# Patient Record
Sex: Female | Born: 1990 | Race: Black or African American | Hispanic: No | Smoking: Former smoker
Health system: Southern US, Community
[De-identification: ages and names within clinical notes are randomized; demographics above are authoritative.]

## PROBLEM LIST (undated history)

## (undated) ENCOUNTER — Inpatient Hospital Stay (HOSPITAL_COMMUNITY): Payer: Self-pay

## (undated) DIAGNOSIS — F209 Schizophrenia, unspecified: Secondary | ICD-10-CM

## (undated) DIAGNOSIS — F319 Bipolar disorder, unspecified: Secondary | ICD-10-CM

## (undated) DIAGNOSIS — S0502XA Injury of conjunctiva and corneal abrasion without foreign body, left eye, initial encounter: Secondary | ICD-10-CM

## (undated) HISTORY — PX: HIP SURGERY: SHX245

## (undated) HISTORY — DX: Injury of conjunctiva and corneal abrasion without foreign body, left eye, initial encounter: S05.02XA

---
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
## 2009-03-05 ENCOUNTER — Ambulatory Visit: Payer: Self-pay | Admitting: Family Medicine

## 2009-03-20 ENCOUNTER — Emergency Department (HOSPITAL_COMMUNITY): Admission: EM | Admit: 2009-03-20 | Discharge: 2009-03-20 | Payer: Self-pay | Admitting: Emergency Medicine

## 2009-03-21 ENCOUNTER — Ambulatory Visit: Payer: Self-pay | Admitting: Family Medicine

## 2009-03-21 ENCOUNTER — Telehealth: Payer: Self-pay | Admitting: Family Medicine

## 2009-03-21 DIAGNOSIS — F1910 Other psychoactive substance abuse, uncomplicated: Secondary | ICD-10-CM | POA: Insufficient documentation

## 2009-04-02 ENCOUNTER — Ambulatory Visit: Payer: Self-pay | Admitting: Family Medicine

## 2009-04-02 DIAGNOSIS — F39 Unspecified mood [affective] disorder: Secondary | ICD-10-CM | POA: Insufficient documentation

## 2009-05-07 ENCOUNTER — Encounter: Payer: Self-pay | Admitting: Family Medicine

## 2009-05-07 ENCOUNTER — Ambulatory Visit: Payer: Self-pay | Admitting: Family Medicine

## 2009-05-07 DIAGNOSIS — F319 Bipolar disorder, unspecified: Secondary | ICD-10-CM

## 2009-05-08 LAB — CONVERTED CEMR LAB
AST: 12 units/L (ref 0–37)
Albumin: 4.4 g/dL (ref 3.5–5.2)
Alkaline Phosphatase: 70 units/L (ref 39–117)
BUN: 11 mg/dL (ref 6–23)
Basophils Absolute: 0 10*3/uL (ref 0.0–0.1)
Basophils Relative: 0 % (ref 0–1)
Creatinine, Ser: 0.83 mg/dL (ref 0.40–1.20)
Eosinophils Absolute: 0.2 10*3/uL (ref 0.0–0.7)
Eosinophils Relative: 3 % (ref 0–5)
Glucose, Bld: 67 mg/dL — ABNORMAL LOW (ref 70–99)
HCT: 41.6 % (ref 36.0–46.0)
Hemoglobin: 13.7 g/dL (ref 12.0–15.0)
MCHC: 32.9 g/dL (ref 30.0–36.0)
MCV: 80.5 fL (ref 78.0–100.0)
Monocytes Absolute: 0.4 10*3/uL (ref 0.1–1.0)
Platelets: 315 10*3/uL (ref 150–400)
RDW: 15.4 % (ref 11.5–15.5)
TSH: 0.595 microintl units/mL (ref 0.350–4.500)

## 2009-05-29 ENCOUNTER — Telehealth: Payer: Self-pay | Admitting: Family Medicine

## 2009-06-03 ENCOUNTER — Ambulatory Visit: Payer: Self-pay | Admitting: Family Medicine

## 2009-06-05 ENCOUNTER — Ambulatory Visit: Payer: Self-pay | Admitting: Family Medicine

## 2009-06-05 ENCOUNTER — Encounter: Payer: Self-pay | Admitting: Family Medicine

## 2009-06-05 LAB — CONVERTED CEMR LAB
Eosinophils Absolute: 0.2 10*3/uL (ref 0.0–0.7)
HCT: 45.9 % (ref 36.0–46.0)
Lymphocytes Relative: 33 % (ref 12–46)
Lymphs Abs: 2 10*3/uL (ref 0.7–4.0)
MCV: 85.2 fL (ref 78.0–100.0)
Monocytes Relative: 8 % (ref 3–12)
Neutrophils Relative %: 56 % (ref 43–77)
Platelets: 365 10*3/uL (ref 150–400)
RBC: 5.39 M/uL — ABNORMAL HIGH (ref 3.87–5.11)
WBC: 6 10*3/uL (ref 4.0–10.5)

## 2009-06-26 ENCOUNTER — Ambulatory Visit: Payer: Self-pay | Admitting: Family Medicine

## 2009-06-27 ENCOUNTER — Encounter: Payer: Self-pay | Admitting: *Deleted

## 2009-06-30 ENCOUNTER — Ambulatory Visit: Payer: Self-pay | Admitting: Family Medicine

## 2009-06-30 ENCOUNTER — Encounter: Payer: Self-pay | Admitting: Family Medicine

## 2009-06-30 LAB — CONVERTED CEMR LAB
Cholesterol: 114 mg/dL (ref 0–169)
Hgb A1c MFr Bld: 5.1 %
Triglycerides: 148 mg/dL (ref <150)

## 2009-07-02 ENCOUNTER — Telehealth: Payer: Self-pay | Admitting: *Deleted

## 2009-07-02 ENCOUNTER — Telehealth: Payer: Self-pay | Admitting: Family Medicine

## 2009-08-23 ENCOUNTER — Encounter (INDEPENDENT_AMBULATORY_CARE_PROVIDER_SITE_OTHER): Payer: Self-pay | Admitting: *Deleted

## 2009-08-23 DIAGNOSIS — F17200 Nicotine dependence, unspecified, uncomplicated: Secondary | ICD-10-CM | POA: Insufficient documentation

## 2009-10-16 ENCOUNTER — Ambulatory Visit: Payer: Self-pay | Admitting: Family Medicine

## 2010-01-02 ENCOUNTER — Ambulatory Visit: Payer: Self-pay | Admitting: Family Medicine

## 2010-04-21 ENCOUNTER — Telehealth: Payer: Self-pay | Admitting: Family Medicine

## 2010-04-22 ENCOUNTER — Ambulatory Visit: Payer: Self-pay | Admitting: Family Medicine

## 2010-04-22 DIAGNOSIS — L299 Pruritus, unspecified: Secondary | ICD-10-CM | POA: Insufficient documentation

## 2010-06-03 ENCOUNTER — Telehealth: Payer: Self-pay | Admitting: Sports Medicine

## 2010-06-18 ENCOUNTER — Telehealth: Payer: Self-pay | Admitting: Family Medicine

## 2010-06-29 ENCOUNTER — Ambulatory Visit: Payer: Self-pay | Admitting: Family Medicine

## 2010-06-29 LAB — CONVERTED CEMR LAB: Beta hcg, urine, semiquantitative: NEGATIVE
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
## 2010-10-02 ENCOUNTER — Ambulatory Visit: Payer: Self-pay | Admitting: Family Medicine

## 2010-10-02 LAB — CONVERTED CEMR LAB: Beta hcg, urine, semiquantitative: NEGATIVE

## 2011-01-06 ENCOUNTER — Ambulatory Visit: Payer: Self-pay

## 2011-01-07 NOTE — Progress Notes (Signed)
Summary: triage   Phone Note Call from Patient Call back at [phone]   Caller: Patient Summary of Call: has a rash all over and wants to come in w/ her sister tomorrow am.  (9:45 to see Nyelli Samara) Initial call taken by: De Nurse,  Apr 21, 2010 2:07 PM  Follow-up for Phone Call        red bumps that itch on her buttocks x2-3 days. unable to place with pcp. will see Dr. Constance Goltz at 9:45 Follow-up by: Golden Circle RN,  Apr 21, 2010 2:28 PM

## 2011-01-07 NOTE — Assessment & Plan Note (Signed)
Summary: rash on buttocks/[place]/Shiree Altemus   Vital Signs:  Patient profile:   20 year old female Height:      64.5 inches Weight:      136.7 pounds BMI:     23.19 Temp:     99.2 degrees F oral Pulse rate:   101 / minute BP sitting:   117 / 78  (left arm) Cuff size:   regular  Vitals Entered By: Garen Grams LPN (Apr 22, 2010 10:00 AM) CC: rash Is Patient Diabetic? No Pain Assessment Patient in pain? no        Primary Care Provider:  Ancil Boozer  MD  CC:  rash.  History of Present Illness: rash: first noticed about 1 wk ago on lower back, buttocks and legs.  very itchy and back felt like it was swollen in the area.  these lesions have since resolved and now she has just 1 clearing lesion at this time between her toes on her left foot and on her R hand 3rd finger.  now the lesions are more like little fluid filled blisters.  it does not help them to be popped as they have done and in fact may make it worse.  she has tried some cream/salve without help.  heat makes it itch much worse.  of note mother and sister with whom she lives also have rashes though with different characteristics and within the past few months they all (including Layne) have been treated for scabies.  she denies fevers or other systemic symptoms at this time and has had no lesions in her mouth.  Habits & Providers  Alcohol-Tobacco-Diet     Tobacco Status: current     Tobacco Counseling: to quit use of tobacco products  Current Medications (verified): 1)  Clobetasol Propionate 0.05 % Soln (Clobetasol Propionate) .... Apply To Very Itchy Areas Two Times A Day Until 2-3 Days After Gone and Then As Needed. Disp 1 Bottle  Allergies (verified): No Known Drug Allergies  Past History:  Past Medical History: auditory neuropathy R ear. 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    on medial aspect of R third finger is small fluid filled blister on erythematous base.    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  discussed skin hygiene in setting of asthma and rx high potency steroid to itchy lesions.  return if worsens or doesn't improve.   Orders: [facility]- Est Level  3 (04540)  Complete Medication List: 1)  Clobetasol Propionate 0.05 % Soln (Clobetasol propionate) .... Apply to very itchy areas two times a day until 2-3 days after gone and then as needed. disp 1 bottle  Patient Instructions: 1)  use the prescription solution twice daily until 2-3 days after improved. 2)  All of the time keep your skin greasy - use lotions that are dye and fragrance free (such as eucerin, vaseline, cetaphil, aveeno, etc) Prescriptions: CLOBETASOL PROPIONATE 0.05 % SOLN (CLOBETASOL PROPIONATE) apply to very itchy areas two times  a day until 2-3 days after gone and then as needed. Disp 1 bottle  #1 x 1   Entered and Authorized by:   Ancil Boozer  MD   Signed by:   Ancil Boozer  MD on 04/22/2010   Method used:   Faxed to ...       [facility] (retail)       [address]       [place], [place]  [address]       Ph: [phone]       Fax: [phone]   RxID:   [id number]

## 2011-01-07 NOTE — Progress Notes (Signed)
Summary: resch   Phone Note Call from Patient   Caller: Patient Summary of Call: pt just called to resch appt b/c she has dental appt also Initial call taken by: De Nurse,  June 18, 2010 1:34 PM  Follow-up for Phone Call        Pt will still be [facility] Follow-up by: Jone Baseman CMA,  June 18, 2010 1:42 PM

## 2011-01-07 NOTE — Assessment & Plan Note (Signed)
Summary: depo inj,tcb   Nurse Visit   Allergies: No Known Drug Allergies  Medication Administration  Injection # 1:    Medication: Depo-Provera 150mg     Diagnosis: CONTRACEPTIVE MANAGEMENT NOS (ICD-V25.9)    Route: IM    Site: LUOQ gluteus    Lot #: [id number]    Mfr: greenstone    Comments: next depo due Aprol 15 thru April 03, 2010    Patient tolerated injection without complications    Given by: Theresia Lo RN (January 02, 2010 11:22 AM)  Orders Added: 1)  Depo-Provera 150mg  [J1055] 2)  Admin of Injection (IM/SQ) [45409]   Medication Administration  Injection # 1:    Medication: Depo-Provera 150mg     Diagnosis: CONTRACEPTIVE MANAGEMENT NOS (ICD-V25.9)    Route: IM    Site: LUOQ gluteus    Lot #: [id number]    Mfr: greenstone    Comments: next depo due Aprol 15 thru April 03, 2010    Patient tolerated injection without complications    Given by: Theresia Lo RN (January 02, 2010 11:22 AM)  Orders Added: 1)  Depo-Provera 150mg  [J1055] 2)  Admin of Injection (IM/SQ) [81191]

## 2011-01-07 NOTE — Progress Notes (Signed)
Summary: Emergency Line Call   Phone Note Call from Patient Call back at [phone]   Caller: Mom Summary of Call: Mother calling, daughter has lump on forearm with red lines tracking upwards.  Somewhat tender.  No fevers/chills, no N/V/D/C.  No cats in house, only dogs, no known entrance wound.  Sounds like lymphangitis, recommended call [facility] in AM for SDA.  Mother will do this. Initial call taken by: Rodney Langton MD,  June 03, 2010 11:42 PM

## 2011-01-07 NOTE — Assessment & Plan Note (Signed)
Summary: depo/eo   Nurse Visit   Allergies: No Known Drug Allergies Laboratory Results   Urine Tests  Date/Time Received: October 02, 2010 3:17 PM  Date/Time Reported: October 02, 2010 3:23 PM     Urine HCG: negative Comments: .............................................Marland KitchenGaren Grams LPN October 02, 2010 3:23 PM     Medication Administration  Injection # 1:    Medication: Depo-Provera 150mg     Diagnosis: CONTRACEPTIVE MANAGEMENT NOS (ICD-V25.9)    Route: IM    Site: L deltoid    Exp Date: 11/05/2012    Lot #: [id number]    Mfr: Francisca December    Comments: Next Depo Due: January 13 - January 27    Patient tolerated injection without complications    Given by: Garen Grams LPN (October 02, 2010 3:44 PM)  Orders Added: 1)  U Preg-[facility] [81025] 2)  Depo-Provera 150mg  [J1055] 3)  Est Level 1- [facility] [29562]    Medication Administration  Injection # 1:    Medication: Depo-Provera 150mg     Diagnosis: CONTRACEPTIVE MANAGEMENT NOS (ICD-V25.9)    Route: IM    Site: L deltoid    Exp Date: 11/05/2012    Lot #: [id number]    Mfr: Francisca December    Comments: Next Depo Due: January 13 - January 27    Patient tolerated injection without complications    Given by: Garen Grams LPN (October 02, 2010 3:44 PM)  Orders Added: 1)  U Preg-[facility] [81025] 2)  Depo-Provera 150mg  [J1055] 3)  Est Level 1- [facility] [78469]

## 2011-01-07 NOTE — Assessment & Plan Note (Signed)
Summary: cpe,df   Vital Signs:  Patient profile:   20 year old female Height:      64.5 inches Weight:      137.5 pounds BMI:     23.32 Temp:     98.5 degrees F oral Pulse rate:   68 / minute BP sitting:   130 / 90  (right arm) Cuff size:   regular  Vitals Entered By: Garen Grams LPN (June 29, 2010 10:25 AM) CC: cpe Is Patient Diabetic? No Pain Assessment Patient in pain? no        Primary Care Provider:  Ancil Boozer  MD  CC:  cpe.  History of Present Illness: 1) Itching: Prior history of scabies treatment Dec 2010, earlier this year as well. Had intially improved with treatment but has now returned. Has not tried anything for itching. Reports itching along arms, between toes and fingers x several weeks. Mom also here with same complaints. Denies fever, cills, URI symptoms, rash.   2) Contraceptive Management: Would like to restart Depo Provera (past due) for contraception. Does not report any side effects/ Not interested in other forms of contraception. Sexually active wit hintermittent condom use.   3) Tobacco use: Precontemplative. Denies chronic cough, dyspnea, wheeze.   Habits & Providers  Alcohol-Tobacco-Diet     Tobacco Status: current     Tobacco Counseling: to quit use of tobacco products  Current Medications (verified): 1)  None  Allergies (verified): No Known Drug Allergies  Social History: Lives with mom, sisters (21, 69, 53, 66).  uses marijuana, alcohol.  smokes.  sexually active since age 46. At [employer] for adult high school dipmloma. Wants to go to hair school.   Review of Systems       as per HPI. Also positive for mood swings. Otherwise negative for balance of 10 systems   Physical Exam  General:  Well-developed,well-nourished,in no acute distress; alert,appropriate and cooperative throughout examination VS reviewed  Head:  normocephalic and atraumatic.   Eyes:  pupils equal, round and reactive to light, extraoccular movements intact, no  conjunctivitis  Ears:  External ear exam shows no significant lesions or deformities.  Otoscopic examination reveals clear canals, tympanic membranes are intact bilaterally without bulging, retraction, inflammation or discharge. Hearing is grossly normal bilaterally. Nose:  External nasal examination shows no deformity or inflammation. Nasal mucosa are pink and moist without lesions or exudates. Mouth:  Oral mucosa and oropharynx without lesions or exudates.  Teeth in good repair. Neck:  No deformities, masses, or tenderness noted. Chest Wall:  No deformities, masses, or tenderness noted. Lungs:  Normal respiratory effort, chest expands symmetrically. Lungs are clear to auscultation, no crackles or wheezes. Heart:  Normal rate and regular rhythm. S1 and S2 normal without gallop, murmur, click, rub or other extra sounds. Abdomen:  Bowel sounds positive,abdomen soft and non-tender without masses, organomegaly or hernias noted. Msk:  No deformity or scoliosis noted of thoracic or lumbar spine.   Pulses:  2+ radials bilaterally  Extremities:  No clubbing, cyanosis, edema, or deformity noted with normal full range of motion of all joints.   Neurologic:  No cranial nerve deficits noted. Station and gait are normal. Plantar reflexes are down-going bilaterally. DTRs are symmetrical throughout. Sensory, motor and coordinative functions appear intact. Skin:  moderate excoriation between fingers and along arms especially at forearms dry skin    Impression & Recommendations:  Problem # 1:  UNSPECIFIED PRURITIC DISORDER (ICD-698.9) Likely return of scabies given presentation, household contacts with similar  symptoms. Will treat with permethrin. Repeat treatment in one week. Follow up six weeks. Benadryl as needed for itching.   Problem # 2:  CONTRACEPTIVE MANAGEMENT NOS (ICD-V25.9) Assessment: Comment Only Upreg negative. Restart Depo. Counseled on using condoms to prevent STDs, importance of returning q3  months for depo.  Orders: U Preg-[facility] ([address]) Depo-Provera 150mg  (U0454)  Problem # 3:  Preventive Health Care (ICD-V70.0) Assessment: Comment Only Normal physical exam today. No vaccines or screening indicated toady. Will follow up on bipolar disorder at return visit.   Problem # 4:  TOBACCO USER (ICD-305.1) Precontemplative. Follow up at next appointment.   Other Orders: [facility] - Est  18-39 yrs (09811)  Patient Instructions: 1)  Apply permethrin cream to entire body as instructed.  2)  You should restart your Symbyax. 3)  I would like you to come back in 6 weeks to discuss your bipolar disorder.  4)  If you have thoughts of or a plan for hurting yourself or others, please give [place] a call   Laboratory Results   Urine Tests  Date/Time Received: June 29, 2010 11:14 AM  Date/Time Reported: June 29, 2010 11:20 AM     Urine HCG: negative Comments: ...........test performed by...........Marland KitchenTerese Door, CMA   Blood Tests   Date/Time Received:         Medication Administration  Injection # 1:    Medication: Depo-Provera 150mg     Diagnosis: CONTRACEPTIVE MANAGEMENT NOS (ICD-V25.9)    Route: IM    Site: L deltoid    Exp Date: 03/06/2012    Lot #: [id number]    Mfr: Pfizer    Comments: Next Depo Due: October 10 - October 24    Patient tolerated injection without complications    Given by: Garen Grams LPN (June 29, 2010 11:35 AM)  Orders Added: 1)  U Preg-[facility] [81025] 2)  Depo-Provera 150mg  [J1055] 3)  [facility] - Est  18-39 yrs [99395]

## 2011-01-13 ENCOUNTER — Ambulatory Visit (INDEPENDENT_AMBULATORY_CARE_PROVIDER_SITE_OTHER): Payer: Medicaid Other | Admitting: Family Medicine

## 2011-01-13 DIAGNOSIS — Z3049 Encounter for surveillance of other contraceptives: Secondary | ICD-10-CM

## 2011-01-13 DIAGNOSIS — Z3042 Encounter for surveillance of injectable contraceptive: Secondary | ICD-10-CM

## 2011-01-13 DIAGNOSIS — Z309 Encounter for contraceptive management, unspecified: Secondary | ICD-10-CM | POA: Insufficient documentation

## 2011-01-13 LAB — POCT URINE PREGNANCY: Preg Test, Ur: NEGATIVE

## 2011-01-13 MED ORDER — MEDROXYPROGESTERONE ACETATE 150 MG/ML IM SUSP
150.0000 mg | Freq: Once | INTRAMUSCULAR | Status: AC
  Administered 2011-01-13: 150 mg via INTRAMUSCULAR

## 2011-01-13 NOTE — Assessment & Plan Note (Signed)
Level one exam nurse visit for depo no physician encounter

## 2011-01-13 NOTE — Progress Notes (Signed)
  Subjective:    Patient ID: Autumn Kaiser, female    DOB: 03-25-1991, 20 y.o.   MRN: [id number]  HPI   Level on e visit no physician encounter occurred only nurse visit for depo administration  Review of Systems     Objective:   Physical Exam        Assessment & Plan:

## 2011-01-25 ENCOUNTER — Encounter (INDEPENDENT_AMBULATORY_CARE_PROVIDER_SITE_OTHER): Payer: Medicaid Other | Admitting: Family Medicine

## 2011-01-25 ENCOUNTER — Encounter: Payer: Self-pay | Admitting: Family Medicine
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
## 2011-01-26 ENCOUNTER — Ambulatory Visit: Payer: Medicaid Other | Admitting: Family Medicine

## 2011-01-26 NOTE — Progress Notes (Signed)
This encounter was created in error - please disregard.  Pt left without being seen.

## 2011-03-17 LAB — URINALYSIS, ROUTINE W REFLEX MICROSCOPIC
Ketones, ur: NEGATIVE mg/dL
Leukocytes, UA: NEGATIVE
Nitrite: NEGATIVE
Protein, ur: 30 mg/dL — AB
Urobilinogen, UA: 0.2 mg/dL (ref 0.0–1.0)

## 2011-03-17 LAB — DIFFERENTIAL
Basophils Relative: 1 % (ref 0–1)
Eosinophils Relative: 1 % (ref 0–5)
Lymphocytes Relative: 26 % (ref 12–46)
Monocytes Absolute: 0.3 10*3/uL (ref 0.1–1.0)
Monocytes Relative: 5 % (ref 3–12)
Neutro Abs: 4.2 10*3/uL (ref 1.7–7.7)

## 2011-03-17 LAB — RAPID URINE DRUG SCREEN, HOSP PERFORMED
Amphetamines: NOT DETECTED
Benzodiazepines: NOT DETECTED
Cocaine: NOT DETECTED
Tetrahydrocannabinol: POSITIVE — AB

## 2011-03-17 LAB — COMPREHENSIVE METABOLIC PANEL
AST: 22 U/L (ref 0–37)
Albumin: 4.3 g/dL (ref 3.5–5.2)
Alkaline Phosphatase: 90 U/L (ref 39–117)
BUN: 5 mg/dL — ABNORMAL LOW (ref 6–23)
GFR calc Af Amer: >60 mL/min (ref >60)
Potassium: 3.7 mEq/L (ref 3.5–5.1)
Total Protein: 8.4 g/dL — ABNORMAL HIGH (ref 6.0–8.3)

## 2011-03-17 LAB — CBC
HCT: 49.8 % — ABNORMAL HIGH (ref 36.0–46.0)
Platelets: 302 10*3/uL (ref 150–400)
RDW: 16.9 % — ABNORMAL HIGH (ref 11.5–15.5)

## 2011-03-17 LAB — URINE MICROSCOPIC-ADD ON

## 2011-03-17 LAB — PREGNANCY, URINE: Preg Test, Ur: NEGATIVE

## 2011-03-17 LAB — ETHANOL: Alcohol, Ethyl (B): 271 mg/dL — ABNORMAL HIGH (ref 0–10)

## 2011-03-22 LAB — WET PREP, GENITAL: Yeast Wet Prep HPF POC: NONE SEEN

## 2011-03-22 LAB — URINALYSIS, ROUTINE W REFLEX MICROSCOPIC
Bilirubin Urine: NEGATIVE
Hgb urine dipstick: NEGATIVE
Specific Gravity, Urine: 1.022 (ref 1.005–1.030)
pH: 7 (ref 5.0–8.0)

## 2011-03-22 LAB — PREGNANCY, URINE: Preg Test, Ur: NEGATIVE

## 2011-03-22 LAB — URINE MICROSCOPIC-ADD ON

## 2011-04-12 ENCOUNTER — Ambulatory Visit (INDEPENDENT_AMBULATORY_CARE_PROVIDER_SITE_OTHER): Payer: Medicaid Other | Admitting: Family Medicine

## 2011-04-12 ENCOUNTER — Encounter: Payer: Self-pay | Admitting: Family Medicine

## 2011-04-12 DIAGNOSIS — S0502XA Injury of conjunctiva and corneal abrasion without foreign body, left eye, initial encounter: Secondary | ICD-10-CM

## 2011-04-12 DIAGNOSIS — Z309 Encounter for contraceptive management, unspecified: Secondary | ICD-10-CM

## 2011-04-12 DIAGNOSIS — S058X9A Other injuries of unspecified eye and orbit, initial encounter: Secondary | ICD-10-CM

## 2011-04-12 HISTORY — DX: Injury of conjunctiva and corneal abrasion without foreign body, left eye, initial encounter: S05.02XA

## 2011-04-12 MED ORDER — ERYTHROMYCIN 5 MG/GM OP OINT: TOPICAL_OINTMENT | Freq: Four times a day (QID) | OPHTHALMIC | Status: AC

## 2011-04-12 MED ORDER — MEDROXYPROGESTERONE ACETATE 150 MG/ML IM SUSP
150.0000 mg | Freq: Once | INTRAMUSCULAR | Status: AC
  Administered 2011-04-12: 150 mg via INTRAMUSCULAR

## 2011-04-12 NOTE — Patient Instructions (Signed)
It was a pleasure to care for you today.  Please use medication as prescribed for 3-5 days.  You may take motrin or tylenol over the counter for pain.  If you experience any worsening swelling, double vision, fever, chills, drainage, or any other concerning symptoms go to the ED immediately.  If you feel better within a week then you may return on an as needed basis or for your next scheduled Well woman exam.   If you do not please make an appointment for one week.
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
## 2011-08-10 ENCOUNTER — Ambulatory Visit (INDEPENDENT_AMBULATORY_CARE_PROVIDER_SITE_OTHER): Payer: Medicaid Other | Admitting: *Deleted

## 2011-08-10 DIAGNOSIS — Z309 Encounter for contraceptive management, unspecified: Secondary | ICD-10-CM

## 2011-08-10 LAB — POCT URINE PREGNANCY: Preg Test, Ur: NEGATIVE

## 2011-08-10 MED ORDER — MEDROXYPROGESTERONE ACETATE 150 MG/ML IM SUSP
150.0000 mg | Freq: Once | INTRAMUSCULAR | Status: AC
  Administered 2011-08-10: 150 mg via INTRAMUSCULAR

## 2011-08-11 ENCOUNTER — Other Ambulatory Visit: Payer: Self-pay | Admitting: Family Medicine

## 2011-08-11 MED ORDER — MEDROXYPROGESTERONE ACETATE 150 MG/ML IM SUSP: 150.0000 mg | INTRAMUSCULAR | Status: DC

## 2011-09-28 ENCOUNTER — Telehealth: Payer: Self-pay | Admitting: Family Medicine

## 2011-09-28 NOTE — Telephone Encounter (Signed)
Hearing impaired in Right ear.  Been having pain for last 3 days in Left ear.  States it feels "swollen."  No decreased hearing.  No recent URI's or illnesses.  Eating well, interactive, no malaise or fatigue.  Due to lack of acuity based on symptoms, recommend she bring patient in to be seen in clinic tomorrow.  Mom was asking about taking her to ED, stated if anything she should be seen at Urgent Care.  Mom states she will wait until AM.

## 2011-10-06 ENCOUNTER — Encounter: Payer: Medicaid Other | Admitting: Family Medicine

## 2011-10-26 ENCOUNTER — Ambulatory Visit: Payer: Medicaid Other

## 2011-11-15 ENCOUNTER — Ambulatory Visit (INDEPENDENT_AMBULATORY_CARE_PROVIDER_SITE_OTHER): Payer: Medicaid Other | Admitting: *Deleted

## 2011-11-15 DIAGNOSIS — Z309 Encounter for contraceptive management, unspecified: Secondary | ICD-10-CM

## 2011-11-15 LAB — POCT URINE PREGNANCY: Preg Test, Ur: NEGATIVE

## 2011-11-15 MED ORDER — MEDROXYPROGESTERONE ACETATE 150 MG/ML IM SUSP
150.0000 mg | Freq: Once | INTRAMUSCULAR | Status: AC
  Administered 2011-11-15: 150 mg via INTRAMUSCULAR

## 2011-11-15 NOTE — Progress Notes (Signed)
In for depo . Late for injection. Urine pregnancy test neg.  States no sexual activity for greater than 2 weeks.  Advised extra protection for next 7 days.

## 2012-03-02 ENCOUNTER — Ambulatory Visit (INDEPENDENT_AMBULATORY_CARE_PROVIDER_SITE_OTHER): Payer: Medicaid Other | Admitting: *Deleted

## 2012-03-02 DIAGNOSIS — Z309 Encounter for contraceptive management, unspecified: Secondary | ICD-10-CM

## 2012-03-02 LAB — POCT URINE PREGNANCY: Preg Test, Ur: NEGATIVE

## 2012-03-02 MED ORDER — MEDROXYPROGESTERONE ACETATE 150 MG/ML IM SUSP
150.0000 mg | Freq: Once | INTRAMUSCULAR | Status: AC
  Administered 2012-03-02: 150 mg via INTRAMUSCULAR

## 2012-03-02 NOTE — Progress Notes (Signed)
Patient late for depo. States last sexual activity was more than 2 weeks ago.  She wants to receive depo today. Advised to return in 2 weeks to repeat u preg.  Appointment scheduled for CPE . Repeat at time. Advised extra protection for next 7 days.

## 2012-03-09 ENCOUNTER — Ambulatory Visit: Payer: Medicaid Other

## 2012-03-17 ENCOUNTER — Ambulatory Visit: Payer: Medicaid Other | Admitting: Family Medicine

## 2012-03-22 ENCOUNTER — Encounter: Payer: Medicaid Other | Admitting: Family Medicine

## 2012-04-08 ENCOUNTER — Encounter (HOSPITAL_COMMUNITY): Payer: Self-pay | Admitting: *Deleted

## 2012-04-08 ENCOUNTER — Emergency Department (HOSPITAL_COMMUNITY)
Admission: EM | Admit: 2012-04-08 | Discharge: 2012-04-08 | Disposition: A | Discharge status: Home / Self Care | Payer: Medicaid Other | Attending: Emergency Medicine | Admitting: Emergency Medicine

## 2012-04-08 DIAGNOSIS — R109 Unspecified abdominal pain: Secondary | ICD-10-CM | POA: Insufficient documentation

## 2012-04-08 DIAGNOSIS — R300 Dysuria: Secondary | ICD-10-CM | POA: Insufficient documentation

## 2012-04-08 DIAGNOSIS — N898 Other specified noninflammatory disorders of vagina: Secondary | ICD-10-CM | POA: Insufficient documentation

## 2012-04-08 HISTORY — DX: Bipolar disorder, unspecified: F31.9

## 2012-04-08 HISTORY — DX: Schizophrenia, unspecified: F20.9

## 2012-04-08 LAB — URINALYSIS, ROUTINE W REFLEX MICROSCOPIC
Glucose, UA: NEGATIVE mg/dL
Ketones, ur: NEGATIVE mg/dL
Nitrite: NEGATIVE
Specific Gravity, Urine: 1.005 (ref 1.005–1.030)
pH: 6.5 (ref 5.0–8.0)

## 2012-04-08 LAB — CBC
HCT: 43 % (ref 36.0–46.0)
Hemoglobin: 14.8 g/dL (ref 12.0–15.0)
RBC: 5 MIL/uL (ref 3.87–5.11)
WBC: 13.8 10*3/uL — ABNORMAL HIGH (ref 4.0–10.5)

## 2012-04-08 LAB — URINE MICROSCOPIC-ADD ON

## 2012-04-08 LAB — DIFFERENTIAL
Basophils Relative: 0 % (ref 0–1)
Lymphocytes Relative: 25 % (ref 12–46)
Lymphs Abs: 3.4 10*3/uL (ref 0.7–4.0)
Monocytes Absolute: 0.9 10*3/uL (ref 0.1–1.0)
Monocytes Relative: 7 % (ref 3–12)
Neutro Abs: 9.4 10*3/uL — ABNORMAL HIGH (ref 1.7–7.7)
Neutrophils Relative %: 68 % (ref 43–77)

## 2012-04-08 LAB — POCT I-STAT, CHEM 8
BUN: <3 mg/dL — ABNORMAL LOW (ref 6–23)
Calcium, Ion: 1.21 mmol/L (ref 1.12–1.32)
Creatinine, Ser: 1.2 mg/dL — ABNORMAL HIGH (ref 0.50–1.10)
Hemoglobin: 16 g/dL — ABNORMAL HIGH (ref 12.0–15.0)
Sodium: 144 mEq/L (ref 135–145)
TCO2: 22 mmol/L (ref 0–100)

## 2012-04-08 LAB — WET PREP, GENITAL: Trich, Wet Prep: NONE SEEN

## 2012-04-08 LAB — POCT PREGNANCY, URINE: Preg Test, Ur: NEGATIVE

## 2012-04-08 NOTE — ED Notes (Signed)
See the doctors notes she saw before myself.  Pt alert asking for food on arrival.  No distress

## 2012-04-08 NOTE — ED Provider Notes (Signed)
Medical screening examination/treatment/procedure(s) were performed by non-physician practitioner and as supervising physician I was immediately available for consultation/collaboration.  Jasmine Awe, MD 04/08/12 [phone]

## 2012-04-08 NOTE — Discharge Instructions (Signed)

## 2012-04-08 NOTE — ED Notes (Signed)
C/o lower abd pain, describes as cramps, also dysuria. (denies: bleeding, back pain, fever, nvd or other sx), also reports vaginal d/c (white). Onset 2-3d ago. States, "feels like a rash".
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
## 2012-04-09 LAB — URINE CULTURE
Colony Count: >=100000
Culture  Setup Time: 201305041120

## 2012-04-10 ENCOUNTER — Emergency Department (HOSPITAL_COMMUNITY)
Admission: EM | Admit: 2012-04-10 | Discharge: 2012-04-10 | Disposition: A | Discharge status: Home / Self Care | Payer: Medicaid Other | Attending: Emergency Medicine | Admitting: Emergency Medicine

## 2012-04-10 ENCOUNTER — Encounter (HOSPITAL_COMMUNITY): Payer: Self-pay | Admitting: *Deleted

## 2012-04-10 DIAGNOSIS — A5400 Gonococcal infection of lower genitourinary tract, unspecified: Secondary | ICD-10-CM | POA: Insufficient documentation

## 2012-04-10 LAB — CBC
MCH: 29.5 pg (ref 26.0–34.0)
MCV: 85.6 fL (ref 78.0–100.0)
Platelets: 300 10*3/uL (ref 150–400)
RBC: 4.57 MIL/uL (ref 3.87–5.11)
RDW: 13.6 % (ref 11.5–15.5)

## 2012-04-10 LAB — BASIC METABOLIC PANEL
Calcium: 9.4 mg/dL (ref 8.4–10.5)
Creatinine, Ser: 0.64 mg/dL (ref 0.50–1.10)
GFR calc Af Amer: >90 mL/min (ref >90)
GFR calc non Af Amer: >90 mL/min (ref >90)
Sodium: 143 mEq/L (ref 135–145)

## 2012-04-10 NOTE — ED Notes (Signed)
Pt is here for abdominal pain which began as lower abdominal pain and has moved to RLQ.  No fever or chills.  Pt has been nauseated and had one episode of vomiting.  Pt has had pain with urination and increased white vaginal discharge

## 2012-04-11 LAB — GC/CHLAMYDIA PROBE AMP, GENITAL: Chlamydia, DNA Probe: NEGATIVE

## 2012-04-14 NOTE — ED Notes (Signed)
+  Gonorrhea. Chart sent to EDP office for review. [employer] attached. °

## 2012-04-15 NOTE — ED Notes (Signed)
Chart returned from EDP office. Prescribed Cefixime 400 mg. One tablet po once daily. #1. Prescribed by Doran Durand PA-C.

## 2012-04-15 NOTE — ED Notes (Signed)
Attempted to call patient. No answer. Left voicemail.

## 2012-04-16 NOTE — ED Notes (Signed)
[employer] faxed 

## 2012-04-16 NOTE — ED Notes (Signed)
+   gonorrhea. Due to hearing impairment patient gave permission to speak with mother regarding lab tests. Patient  notified of positive result via mother , STD instructions given. RX Cefixime 400 mg PO x one dose called to [facility] [phone].

## 2012-04-16 NOTE — ED Notes (Signed)
Attempted to call patient. Phone cannot receive messages. Will send letter.

## 2012-06-15 ENCOUNTER — Ambulatory Visit: Payer: Medicaid Other

## 2012-06-20 ENCOUNTER — Ambulatory Visit: Payer: Medicaid Other

## 2012-06-22 ENCOUNTER — Ambulatory Visit: Payer: Medicaid Other

## 2012-07-11 ENCOUNTER — Ambulatory Visit (INDEPENDENT_AMBULATORY_CARE_PROVIDER_SITE_OTHER): Payer: Medicaid Other | Admitting: *Deleted

## 2012-07-11 DIAGNOSIS — Z309 Encounter for contraceptive management, unspecified: Secondary | ICD-10-CM

## 2012-07-11 NOTE — Progress Notes (Signed)
Patient in for depo injection today. It is noted that patient no showed for CPE in April 2013. She has been late to  receive depo,  today makes 5 times since 04/11/2012. Last office visit was 04/12/2011 and last CPE  06/2010. Consulted with Dr. Leveda Anna and it was decided that patient will need CPE before Depo is given. Appointment scheduled for 07/14/2012.  Reminder card given. States she has not been having sex recently.

## 2012-07-14 ENCOUNTER — Encounter: Payer: Medicaid Other | Admitting: Family Medicine

## 2012-09-26 ENCOUNTER — Ambulatory Visit (INDEPENDENT_AMBULATORY_CARE_PROVIDER_SITE_OTHER): Payer: Medicaid Other | Admitting: *Deleted

## 2012-09-26 DIAGNOSIS — Z309 Encounter for contraceptive management, unspecified: Secondary | ICD-10-CM

## 2012-09-26 NOTE — Progress Notes (Signed)
Regarding  previous note error correction:  It is noted patient that patient  was late for depo 5 times since 04/12/2011, not 2013 as charted.

## 2012-09-26 NOTE — Progress Notes (Signed)
Patient no showed for CPE on 07/14/2012. See previous note from 07/11/2012.  Explained to patient that she will need  visit with  MD for CPE  before  can restart Depo.  This was 2 no shows for CPE this year.   Patient mother than came in to discuss  along with patient in the room. Appointment scheduled for 10/29 for CPE and can restart Depo at that time. Encouraged to use protection if she does have  sex prior to next week when appointment is scheduled.Marland Kitchen

## 2012-10-03 ENCOUNTER — Other Ambulatory Visit (HOSPITAL_COMMUNITY)
Admission: RE | Admit: 2012-10-03 | Discharge: 2012-10-03 | Disposition: A | Discharge status: Home / Self Care | Payer: Medicaid Other | Source: Ambulatory Visit | Attending: Family Medicine | Admitting: Family Medicine

## 2012-10-03 ENCOUNTER — Ambulatory Visit (INDEPENDENT_AMBULATORY_CARE_PROVIDER_SITE_OTHER): Payer: Medicaid Other | Admitting: Family Medicine

## 2012-10-03 ENCOUNTER — Encounter: Payer: Self-pay | Admitting: Family Medicine

## 2012-10-03 ENCOUNTER — Ambulatory Visit: Payer: Medicaid Other | Admitting: Family Medicine

## 2012-10-03 VITALS — BP 124/81 | HR 103 | Temp 98.7°F | Ht 64.5 in | Wt 167.0 lb

## 2012-10-03 DIAGNOSIS — Z309 Encounter for contraceptive management, unspecified: Secondary | ICD-10-CM

## 2012-10-03 DIAGNOSIS — Z23 Encounter for immunization: Secondary | ICD-10-CM

## 2012-10-03 DIAGNOSIS — Z0000 Encounter for general adult medical examination without abnormal findings: Secondary | ICD-10-CM

## 2012-10-03 DIAGNOSIS — Z113 Encounter for screening for infections with a predominantly sexual mode of transmission: Secondary | ICD-10-CM | POA: Insufficient documentation

## 2012-10-03 DIAGNOSIS — N760 Acute vaginitis: Secondary | ICD-10-CM

## 2012-10-03 DIAGNOSIS — Z124 Encounter for screening for malignant neoplasm of cervix: Secondary | ICD-10-CM

## 2012-10-03 DIAGNOSIS — F1910 Other psychoactive substance abuse, uncomplicated: Secondary | ICD-10-CM

## 2012-10-03 DIAGNOSIS — F17200 Nicotine dependence, unspecified, uncomplicated: Secondary | ICD-10-CM

## 2012-10-03 DIAGNOSIS — F39 Unspecified mood [affective] disorder: Secondary | ICD-10-CM

## 2012-10-03 DIAGNOSIS — Z01419 Encounter for gynecological examination (general) (routine) without abnormal findings: Secondary | ICD-10-CM | POA: Insufficient documentation

## 2012-10-03 LAB — POCT URINE PREGNANCY: Preg Test, Ur: NEGATIVE

## 2012-10-03 MED ORDER — CALCIUM-VITAMIN D 250-125 MG-UNIT PO TABS: 1.0000 | ORAL_TABLET | Freq: Every day | ORAL | Status: DC

## 2012-10-03 MED ORDER — MEDROXYPROGESTERONE ACETATE 150 MG/ML IM SUSP
150.0000 mg | Freq: Once | INTRAMUSCULAR | Status: AC
  Administered 2012-10-03: 150 mg via INTRAMUSCULAR

## 2012-10-03 NOTE — Patient Instructions (Addendum)
Shira,   Thank you for coming in today.   I will call with pap smear and lab results.  Make an appt with Dr. Mikel Cella to discuss depression.   Next depo due in 3 months.  Pick up and take calcium with Vit D.   Smoking cessation support: smoking cessation hotline: 1-800-QUIT-NOW.  Here is the number to the smoking cessation classes at [facility]: [phone]   Dr. Armen Pickup
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
## 2012-10-05 ENCOUNTER — Encounter: Payer: Self-pay | Admitting: *Deleted

## 2012-10-05 ENCOUNTER — Encounter: Payer: Self-pay | Admitting: Family Medicine

## 2012-10-05 DIAGNOSIS — Z124 Encounter for screening for malignant neoplasm of cervix: Secondary | ICD-10-CM | POA: Insufficient documentation

## 2012-10-05 DIAGNOSIS — Z0000 Encounter for general adult medical examination without abnormal findings: Secondary | ICD-10-CM | POA: Insufficient documentation

## 2012-10-05 NOTE — Assessment & Plan Note (Signed)
A: persistent.  P: counseled to quit.

## 2012-10-05 NOTE — Assessment & Plan Note (Signed)
A: pap smear, Gc/Chlam testing and flu shot today.

## 2012-10-05 NOTE — Assessment & Plan Note (Signed)
A: bipolar disorder vs psychotic disorder the diagnosis is unclear and best determined by skilled psychologist/psychiatrist. Mood currently stable. Patient is not a danger to herself or others.  P: Defer to PCP for screening (depression, anxiety, bipolar) Recommend referral to mood disorder clinic or community behavioral health provider.

## 2012-10-05 NOTE — Assessment & Plan Note (Signed)
A: negative urine pregnancy test.  P: depo today and q 3 months.

## 2012-10-30 ENCOUNTER — Ambulatory Visit (INDEPENDENT_AMBULATORY_CARE_PROVIDER_SITE_OTHER): Payer: Medicaid Other | Admitting: Family Medicine

## 2012-10-30 ENCOUNTER — Encounter: Payer: Self-pay | Admitting: Family Medicine

## 2012-10-30 VITALS — BP 136/83 | HR 74 | Temp 99.0°F | Wt 161.8 lb

## 2012-10-30 DIAGNOSIS — S1193XA Puncture wound without foreign body of unspecified part of neck, initial encounter: Secondary | ICD-10-CM | POA: Insufficient documentation

## 2012-10-30 DIAGNOSIS — S1190XA Unspecified open wound of unspecified part of neck, initial encounter: Secondary | ICD-10-CM

## 2012-10-30 DIAGNOSIS — Z23 Encounter for immunization: Secondary | ICD-10-CM

## 2012-10-30 DIAGNOSIS — M7989 Other specified soft tissue disorders: Secondary | ICD-10-CM

## 2012-10-30 MED ORDER — TRAMADOL HCL 50 MG PO TABS: 50.0000 mg | ORAL_TABLET | Freq: Three times a day (TID) | ORAL | Status: DC | PRN

## 2012-10-30 MED ORDER — CEPHALEXIN 500 MG PO CAPS: 500.0000 mg | ORAL_CAPSULE | Freq: Two times a day (BID) | ORAL | Status: DC

## 2012-10-30 NOTE — Patient Instructions (Addendum)
The antibiotic is called Keflex.  Take this twice a day for 7 days until the bottle is completely empty.  The pain reliever is called Tramadol.  Take this up to every 8 hours for pain relief.  If you start notice bleeding, drainage, or redness that is spreading come back or go to the ED.
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
## 2012-10-30 NOTE — Assessment & Plan Note (Addendum)
Tetanus given today. Very superficial wound.  Wound washed off in clinic.  Already starting to heal, has closed up.  No need for sutures. Antitibiotic ointment and gauze applied.   Some signs of surrounding cellulitis already, keflex to treat.  No abscess or drainage.    FU in 2 weeks to assess for improvement.

## 2012-11-23 ENCOUNTER — Emergency Department (HOSPITAL_COMMUNITY)
Admission: EM | Admit: 2012-11-23 | Discharge: 2012-11-23 | Disposition: A | Discharge status: Home / Self Care | Payer: Medicaid Other | Attending: Emergency Medicine | Admitting: Emergency Medicine

## 2012-11-23 DIAGNOSIS — F319 Bipolar disorder, unspecified: Secondary | ICD-10-CM | POA: Insufficient documentation

## 2012-11-23 DIAGNOSIS — Z87828 Personal history of other (healed) physical injury and trauma: Secondary | ICD-10-CM | POA: Insufficient documentation

## 2012-11-23 DIAGNOSIS — Z79899 Other long term (current) drug therapy: Secondary | ICD-10-CM | POA: Insufficient documentation

## 2012-11-23 DIAGNOSIS — F17200 Nicotine dependence, unspecified, uncomplicated: Secondary | ICD-10-CM | POA: Insufficient documentation

## 2012-11-23 DIAGNOSIS — T490X1A Poisoning by local antifungal, anti-infective and anti-inflammatory drugs, accidental (unintentional), initial encounter: Secondary | ICD-10-CM | POA: Insufficient documentation

## 2012-11-23 DIAGNOSIS — F29 Unspecified psychosis not due to a substance or known physiological condition: Secondary | ICD-10-CM | POA: Insufficient documentation

## 2012-11-23 DIAGNOSIS — Z3202 Encounter for pregnancy test, result negative: Secondary | ICD-10-CM | POA: Insufficient documentation

## 2012-11-23 DIAGNOSIS — Z8659 Personal history of other mental and behavioral disorders: Secondary | ICD-10-CM | POA: Insufficient documentation

## 2012-11-23 DIAGNOSIS — T50902A Poisoning by unspecified drugs, medicaments and biological substances, intentional self-harm, initial encounter: Secondary | ICD-10-CM

## 2012-11-23 DIAGNOSIS — F1010 Alcohol abuse, uncomplicated: Secondary | ICD-10-CM | POA: Insufficient documentation

## 2012-11-23 DIAGNOSIS — T50992A Poisoning by other drugs, medicaments and biological substances, intentional self-harm, initial encounter: Secondary | ICD-10-CM | POA: Insufficient documentation

## 2012-11-23 DIAGNOSIS — F10929 Alcohol use, unspecified with intoxication, unspecified: Secondary | ICD-10-CM

## 2012-11-23 LAB — COMPREHENSIVE METABOLIC PANEL
ALT: 148 U/L — ABNORMAL HIGH (ref 0–35)
AST: 233 U/L — ABNORMAL HIGH (ref 0–37)
CO2: 22 mEq/L (ref 19–32)
Calcium: 9.3 mg/dL (ref 8.4–10.5)
Chloride: 103 mEq/L (ref 96–112)
GFR calc non Af Amer: >90 mL/min (ref >90)
Potassium: 3.7 mEq/L (ref 3.5–5.1)
Sodium: 140 mEq/L (ref 135–145)
Total Bilirubin: 0.2 mg/dL — ABNORMAL LOW (ref 0.3–1.2)

## 2012-11-23 LAB — RAPID URINE DRUG SCREEN, HOSP PERFORMED: Opiates: NOT DETECTED

## 2012-11-23 LAB — POCT PREGNANCY, URINE: Preg Test, Ur: NEGATIVE

## 2012-11-23 LAB — CBC
Platelets: 234 10*3/uL (ref 150–400)
RBC: 4.81 MIL/uL (ref 3.87–5.11)
WBC: 5.5 10*3/uL (ref 4.0–10.5)

## 2012-11-23 LAB — GLUCOSE, CAPILLARY

## 2012-11-23 LAB — SALICYLATE LEVEL: Salicylate Lvl: <2 mg/dL — ABNORMAL LOW (ref 2.8–20.0)

## 2012-11-23 MED ORDER — ACETAMINOPHEN 325 MG PO TABS: 650.0000 mg | ORAL_TABLET | Freq: Once | ORAL | Status: DC

## 2012-11-23 MED ORDER — SODIUM CHLORIDE 0.9 % IV BOLUS (SEPSIS)
1000.0000 mL | Freq: Once | INTRAVENOUS | Status: AC
  Administered 2012-11-23: 1000 mL via INTRAVENOUS

## 2012-11-23 NOTE — B[facility] Counselor (Signed)
Tele-psych initiated
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
## 2012-11-23 NOTE — ED Notes (Signed)
Activated charcoal

## 2012-11-23 NOTE — BHH Counselor (Addendum)
D/C per telepsych; IVC rescinded.

## 2012-11-23 NOTE — ED Notes (Signed)
Bed:WA11<BR> Expected date:<BR> Expected time:<BR> Means of arrival:<BR> Comments:<BR>

## 2012-11-23 NOTE — ED Notes (Signed)
VOZ:[id number]<QI> Expected date:<BR> Expected time:<BR> Means of arrival:<BR> Comments:<BR> EMS 21 yo F Ingestion Cephalexin

## 2012-11-23 NOTE — ED Notes (Signed)
Pt states she feel slight remorse for taking all the pills but not really because she has a lot that has been going on in her life over the past few months and she is tired of dealing with it. Pt continues to have SI.

## 2012-11-23 NOTE — ED Provider Notes (Signed)
Autumn Kaiser is a 21 y.o. female who presented for evaluation of suicide attempt by taking Keflex and alcohol intoxication. She has been medically cleared. We are awaiting sobriety to allow for psychiatric evaluation. Estimated time of sobriety is 1500 hours today. She has been involuntarily committed.  At this time, the patient is easily arousable, and ask when she can go home. She has not sober. She is likely, alert enough for a psychiatric consultation. 08:05  Will move patient to the psychiatric holding area..   She was seen by the tele-psychiatrist, who felt that she was stable to go home, and followup with community mental health for stress management     Flint Melter, MD 11/23/12 1350

## 2012-11-23 NOTE — ED Notes (Signed)
MD at bedside. 

## 2012-11-23 NOTE — ED Notes (Signed)
Jerene Pitch mother's home phone: [phone]

## 2012-11-23 NOTE — ED Notes (Addendum)
Pt took 14 Cephalexin  500 MG. Pt was stressed out and wanted to hurt herself. Pt drank beer this evening also

## 2012-11-23 NOTE — ED Notes (Signed)
Will move to psych ed at1100 

## 2012-11-23 NOTE — ED Notes (Signed)
Pt has been wanded by security and mother took home all belongings RN is informed

## 2012-11-23 NOTE — ED Notes (Signed)
Poison control  Notified. Check renal fx and electrolytes and give activated charcoal was recommended

## 2012-11-25 ENCOUNTER — Telehealth (HOSPITAL_COMMUNITY): Payer: Self-pay | Admitting: Licensed Clinical Social Worker

## 2013-02-01 ENCOUNTER — Ambulatory Visit: Payer: Medicaid Other

## 2013-09-21 ENCOUNTER — Ambulatory Visit (INDEPENDENT_AMBULATORY_CARE_PROVIDER_SITE_OTHER): Payer: Medicaid Other | Admitting: *Deleted

## 2013-09-21 DIAGNOSIS — Z309 Encounter for contraceptive management, unspecified: Secondary | ICD-10-CM

## 2013-09-21 MED ORDER — MEDROXYPROGESTERONE ACETATE 150 MG/ML IM SUSP
150.0000 mg | Freq: Once | INTRAMUSCULAR | Status: AC
  Administered 2013-09-21: 150 mg via INTRAMUSCULAR

## 2013-09-21 NOTE — Progress Notes (Signed)
Patient in today for depo, last injection was last year. Urine pregnancy obtained which was negative. Patient states her last unprotected intercourse was 2 weeks ago. Informed patient that she would need to return to clinic in 2 weeks for repeat pregnancy test. Injection given in left deltoid, patient without complaints. Next depo due January 2 - January 16, patient aware.

## 2013-10-01 ENCOUNTER — Ambulatory Visit: Payer: Medicaid Other

## 2013-12-17 ENCOUNTER — Encounter: Payer: Medicaid Other | Admitting: Family Medicine

## 2014-01-11 ENCOUNTER — Encounter: Payer: Medicaid Other | Admitting: Family Medicine

## 2014-02-21 ENCOUNTER — Emergency Department (HOSPITAL_COMMUNITY): Payer: Medicaid Other

## 2014-02-21 ENCOUNTER — Emergency Department (HOSPITAL_COMMUNITY)
Admission: EM | Admit: 2014-02-21 | Discharge: 2014-02-21 | Disposition: A | Discharge status: Home / Self Care | Payer: Medicaid Other | Attending: Emergency Medicine | Admitting: Emergency Medicine

## 2014-02-21 ENCOUNTER — Encounter (HOSPITAL_COMMUNITY): Payer: Self-pay | Admitting: Emergency Medicine

## 2014-02-21 DIAGNOSIS — Y939 Activity, unspecified: Secondary | ICD-10-CM | POA: Insufficient documentation

## 2014-02-21 DIAGNOSIS — S6991XA Unspecified injury of right wrist, hand and finger(s), initial encounter: Secondary | ICD-10-CM

## 2014-02-21 DIAGNOSIS — S6990XA Unspecified injury of unspecified wrist, hand and finger(s), initial encounter: Secondary | ICD-10-CM | POA: Insufficient documentation

## 2014-02-21 DIAGNOSIS — F17200 Nicotine dependence, unspecified, uncomplicated: Secondary | ICD-10-CM | POA: Insufficient documentation

## 2014-02-21 DIAGNOSIS — Z8659 Personal history of other mental and behavioral disorders: Secondary | ICD-10-CM | POA: Insufficient documentation

## 2014-02-21 DIAGNOSIS — IMO0002 Reserved for concepts with insufficient information to code with codable children: Secondary | ICD-10-CM | POA: Insufficient documentation

## 2014-02-21 DIAGNOSIS — Y929 Unspecified place or not applicable: Secondary | ICD-10-CM | POA: Insufficient documentation

## 2014-02-21 MED ORDER — IBUPROFEN 800 MG PO TABS
800.0000 mg | ORAL_TABLET | Freq: Once | ORAL | Status: AC
  Administered 2014-02-21: 800 mg via ORAL
  Filled 2014-02-21: qty 1

## 2014-02-21 MED ORDER — IBUPROFEN 800 MG PO TABS: 800.0000 mg | ORAL_TABLET | Freq: Three times a day (TID) | ORAL | Status: DC
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
## 2014-02-21 NOTE — Discharge Instructions (Signed)
Take ibuprofen as needed for pain. Rest, ice and elevate the injury. Refer to attached documents for more information.

## 2014-02-22 NOTE — ED Provider Notes (Signed)
Medical screening examination/treatment/procedure(s) were performed by non-physician practitioner and as supervising physician I was immediately available for consultation/collaboration.  Candyce ChurnJohn David Niamya Vittitow III, MD 02/22/14 [phone]

## 2014-05-28 ENCOUNTER — Ambulatory Visit (INDEPENDENT_AMBULATORY_CARE_PROVIDER_SITE_OTHER): Payer: Medicaid Other | Admitting: Family Medicine

## 2014-05-28 ENCOUNTER — Encounter: Payer: Self-pay | Admitting: Family Medicine

## 2014-05-28 VITALS — BP 125/81 | HR 62 | Temp 98.7°F | Wt 140.0 lb

## 2014-05-28 DIAGNOSIS — F319 Bipolar disorder, unspecified: Secondary | ICD-10-CM

## 2014-05-28 DIAGNOSIS — Z30019 Encounter for initial prescription of contraceptives, unspecified: Secondary | ICD-10-CM

## 2014-05-28 DIAGNOSIS — Z3009 Encounter for other general counseling and advice on contraception: Secondary | ICD-10-CM

## 2014-05-28 DIAGNOSIS — Z0000 Encounter for general adult medical examination without abnormal findings: Secondary | ICD-10-CM

## 2014-05-28 LAB — POCT URINE PREGNANCY: PREG TEST UR: NEGATIVE

## 2014-05-28 MED ORDER — MEDROXYPROGESTERONE ACETATE 150 MG/ML IM SUSP
150.0000 mg | Freq: Once | INTRAMUSCULAR | Status: AC
  Administered 2014-05-28: 150 mg via INTRAMUSCULAR

## 2014-05-28 MED ORDER — OLANZAPINE-FLUOXETINE HCL 6-25 MG PO CAPS: 1.0000 | ORAL_CAPSULE | Freq: Every day | ORAL | Status: DC

## 2014-05-28 MED ORDER — OLANZAPINE-FLUOXETINE HCL 12-50 MG PO CAPS: 1.0000 | ORAL_CAPSULE | Freq: Every evening | ORAL | Status: DC

## 2014-05-28 MED ORDER — SYMBYAX 6-25 MG PO CAPS: 1.0000 | ORAL_CAPSULE | Freq: Every day | ORAL | Status: DC

## 2014-05-28 NOTE — Assessment & Plan Note (Signed)
A: Previous diagnosis, not currently medicated. Patient is concerned about her mood. Does not seem to be in a manic or depressed state today.  P: - Restart Symbyax at a lower dose, with room to titrate up as needed - Encouraged to re-establish with a mental health provider - Given reasons to seek immediate care - Return in 1 week to meet new provider and discuss how medications are working for her

## 2014-05-28 NOTE — Assessment & Plan Note (Signed)
A: UTD on health maintenance  P: - Pap in 2016 - Flu shot this fall

## 2014-05-28 NOTE — Assessment & Plan Note (Signed)
A: Desires depo provera  P: - Preg neg - Depo given

## 2014-05-28 NOTE — Patient Instructions (Signed)
We will start your medication today. Please take it daily and follow up with Dr. Karie Schwalbe next week.   If you have any concerns or feel unsafe, please go to the emergency department.  Take care! Amber M. Hairford, M.D.

## 2014-05-28 NOTE — Progress Notes (Signed)
Patient ID: Philippa ChesterCherish E Crago, female   DOB: 06/19/1991, 23 y.o.   MRN: [id number]    Subjective: HPI: Patient is a 23 y.o. female presenting to clinic today for CPE. Concerns today include bipolar (needs to get back on medication)  1. Health maintenance: - Last pap was 2013, normal. Repeat 2016. - Needs flu shot in the fall  2. Contraception: - Last depo was in October. Upreg today. Would like to get the shot today. Not interested in other options since this has worked well for her in the past.  3. Bipolar: Diagnosed in 2011, however has been seen by mental health since last age 23. Last took medication more than 2 years ago. She states that currently she feels angry and emotional. No SI, reports wanting to hurt others but no plans and no thoughts of suicide. Admitted last year for overdose. Denies drug use at this time, but states she drinks alcohol. Lives with mom, grandma, older sister. Feels safe at home. Not in a relationship. She states she stays up all night on facebook. No erratic thoughts or behaviors at this time. She states she feels like she should be back on medication. Last Rx was Symbyax which worked well for her  History Reviewed: Daily smoker.  ROS: Please see HPI above.  Objective: Office vital signs reviewed. BP 125/81  Pulse 62  Temp(Src) 98.7 F (37.1 C) (Oral)  Wt 140 lb (63.504 kg)  LMP 04/27/2014  Physical Examination:  General: Awake, alert. NAD HEENT: Atraumatic, normocephalic. TM wnl. Posterior pharynx wnl. Neck: No masses palpated. No LAD Pulm: CTAB, no wheezes Cardio: RRR, no murmurs appreciated Abdomen:+BS, soft, nontender, nondistended Extremities: No edema Neuro: Strength and sensation grossly intact Psych: Good eye contact. Normal hygiene. Speaks in complete thoughts. No flight of ideas or pressured speech. Mood and affect normal.  Assessment: 23 y.o. female CPE  Plan: See Problem List and After Visit Summary

## 2014-07-12 ENCOUNTER — Encounter (HOSPITAL_COMMUNITY): Payer: Self-pay | Admitting: Emergency Medicine

## 2014-07-12 ENCOUNTER — Emergency Department (HOSPITAL_COMMUNITY)
Admission: EM | Admit: 2014-07-12 | Discharge: 2014-07-13 | Disposition: A | Discharge status: Home / Self Care | Payer: Medicaid Other | Attending: Emergency Medicine | Admitting: Emergency Medicine

## 2014-07-12 DIAGNOSIS — F17200 Nicotine dependence, unspecified, uncomplicated: Secondary | ICD-10-CM | POA: Diagnosis not present

## 2014-07-12 DIAGNOSIS — Z8659 Personal history of other mental and behavioral disorders: Secondary | ICD-10-CM | POA: Diagnosis not present

## 2014-07-12 DIAGNOSIS — F1010 Alcohol abuse, uncomplicated: Secondary | ICD-10-CM | POA: Insufficient documentation

## 2014-07-12 DIAGNOSIS — Z3202 Encounter for pregnancy test, result negative: Secondary | ICD-10-CM | POA: Diagnosis not present

## 2014-07-12 DIAGNOSIS — F1210 Cannabis abuse, uncomplicated: Secondary | ICD-10-CM | POA: Insufficient documentation

## 2014-07-12 DIAGNOSIS — Z87828 Personal history of other (healed) physical injury and trauma: Secondary | ICD-10-CM | POA: Diagnosis not present

## 2014-07-12 DIAGNOSIS — F10920 Alcohol use, unspecified with intoxication, uncomplicated: Secondary | ICD-10-CM

## 2014-07-12 LAB — CBC WITH DIFFERENTIAL/PLATELET
BASOS ABS: 0 10*3/uL (ref 0.0–0.1)
Basophils Relative: 0 % (ref 0–1)
Eosinophils Absolute: 0.1 10*3/uL (ref 0.0–0.7)
Eosinophils Relative: 1 % (ref 0–5)
HEMATOCRIT: 45.6 % (ref 36.0–46.0)
Hemoglobin: 15.4 g/dL — ABNORMAL HIGH (ref 12.0–15.0)
Lymphocytes Relative: 43 % (ref 12–46)
Lymphs Abs: 2.5 10*3/uL (ref 0.7–4.0)
MCH: 29.4 pg (ref 26.0–34.0)
MCHC: 33.8 g/dL (ref 30.0–36.0)
MCV: 87 fL (ref 78.0–100.0)
Monocytes Absolute: 0.5 10*3/uL (ref 0.1–1.0)
Monocytes Relative: 9 % (ref 3–12)
NEUTROS ABS: 2.8 10*3/uL (ref 1.7–7.7)
Neutrophils Relative %: 47 % (ref 43–77)
PLATELETS: 299 10*3/uL (ref 150–400)
RBC: 5.24 MIL/uL — ABNORMAL HIGH (ref 3.87–5.11)
RDW: 13.7 % (ref 11.5–15.5)
WBC: 5.8 10*3/uL (ref 4.0–10.5)

## 2014-07-12 LAB — URINALYSIS, ROUTINE W REFLEX MICROSCOPIC
BILIRUBIN URINE: NEGATIVE
GLUCOSE, UA: NEGATIVE mg/dL
HGB URINE DIPSTICK: NEGATIVE
KETONES UR: NEGATIVE mg/dL
LEUKOCYTES UA: NEGATIVE
Nitrite: NEGATIVE
PH: 7 (ref 5.0–8.0)
Protein, ur: NEGATIVE mg/dL
Specific Gravity, Urine: 1.005 (ref 1.005–1.030)
Urobilinogen, UA: 0.2 mg/dL (ref 0.0–1.0)

## 2014-07-12 LAB — RAPID URINE DRUG SCREEN, HOSP PERFORMED
AMPHETAMINES: NOT DETECTED
Barbiturates: NOT DETECTED
Benzodiazepines: NOT DETECTED
COCAINE: NOT DETECTED
OPIATES: NOT DETECTED
Tetrahydrocannabinol: POSITIVE — AB

## 2014-07-12 LAB — BASIC METABOLIC PANEL
ANION GAP: 19 — AB (ref 5–15)
BUN: 6 mg/dL (ref 6–23)
CHLORIDE: 106 meq/L (ref 96–112)
CO2: 19 mEq/L (ref 19–32)
Calcium: 9.5 mg/dL (ref 8.4–10.5)
Creatinine, Ser: 0.59 mg/dL (ref 0.50–1.10)
GFR calc Af Amer: >90 mL/min (ref >90)
GFR calc non Af Amer: >90 mL/min (ref >90)
Glucose, Bld: 105 mg/dL — ABNORMAL HIGH (ref 70–99)
POTASSIUM: 3.7 meq/L (ref 3.7–5.3)
SODIUM: 144 meq/L (ref 137–147)

## 2014-07-12 LAB — POC URINE PREG, ED: Preg Test, Ur: NEGATIVE

## 2014-07-12 LAB — ETHANOL: ALCOHOL ETHYL (B): 346 mg/dL — AB (ref 0–11)

## 2014-07-12 LAB — ACETAMINOPHEN LEVEL: Acetaminophen (Tylenol), Serum: <15 ug/mL (ref 10–30)

## 2014-07-12 LAB — SALICYLATE LEVEL

## 2014-07-12 MED ORDER — STERILE WATER FOR INJECTION IJ SOLN
INTRAMUSCULAR | Status: AC
  Administered 2014-07-12: 10 mL
  Filled 2014-07-12: qty 10

## 2014-07-12 MED ORDER — ZIPRASIDONE MESYLATE 20 MG IM SOLR
10.0000 mg | Freq: Once | INTRAMUSCULAR | Status: AC
  Administered 2014-07-12: 10 mg via INTRAMUSCULAR
  Filled 2014-07-12: qty 20

## 2014-07-12 NOTE — ED Notes (Signed)
Bed: WA09 Expected date:  Expected time:  Means of arrival:  Comments: EMS, ETOH

## 2014-07-12 NOTE — ED Notes (Signed)
Pt found by family on couch drooling. Reports of pt taking ectasy, crack and heroin as well as ETOH. Pt is alert and oriented x3 and has been aggressive with EMS. Pt refused IV by EMS and did not want to be touched. Pt is unable to hear out of left ear but is able to read lips.Pt is ambulatory.

## 2014-07-12 NOTE — ED Notes (Signed)
PT IS IN RESTROOM AT THIS TIME. RN IS GOING TO ATTEMPT IV
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
## 2014-07-13 LAB — BASIC METABOLIC PANEL
ANION GAP: 18 — AB (ref 5–15)
BUN: 6 mg/dL (ref 6–23)
CALCIUM: 8.8 mg/dL (ref 8.4–10.5)
CO2: 21 meq/L (ref 19–32)
Chloride: 106 mEq/L (ref 96–112)
Creatinine, Ser: 0.57 mg/dL (ref 0.50–1.10)
GFR calc Af Amer: >90 mL/min (ref >90)
GFR calc non Af Amer: >90 mL/min (ref >90)
Glucose, Bld: 97 mg/dL (ref 70–99)
POTASSIUM: 4.1 meq/L (ref 3.7–5.3)
SODIUM: 145 meq/L (ref 137–147)

## 2014-07-13 NOTE — ED Provider Notes (Signed)
Medical screening examination/treatment/procedure(s) were conducted as a shared visit with non-physician practitioner(s) and myself.  I personally evaluated the patient during the encounter  Please see my separate respective documentation pertaining to this patient encounter   Vida RollerBrian D Kerie Badger, MD 07/13/14 [phone]

## 2014-07-13 NOTE — ED Notes (Signed)
Pt father is at bedside. Pt is currently asleep.

## 2014-07-13 NOTE — ED Notes (Signed)
Pt became aggressive when staff attempted to insert IV. Pt family able to calm pt down enough for staff to insert IV. Pt agitated unable to sit still.

## 2014-07-13 NOTE — ED Notes (Signed)
Pt has visible rise and fall of chest with adequate respirations and oxygen sats (on cardiac monitor). Pt continues to be noncompliant so no vital signs obtained at this time. Pt father at pt bedside.

## 2014-07-13 NOTE — ED Provider Notes (Signed)
23 year old female, history of underlying mental health disorder as well as heavy alcohol use who presents by ambulance after being found with altered mental status drooling and alcohol intoxicated. The patient is sleeping, difficult to arouse, alcohol of 350, basic metabolic panel showing a slight anion gap. The patient awakens with vigorous stimulation, moves all 4 extremities, will need to sober and give history of not taking any other specific ingestants, check to make sure anion gap improving, recheck a basic metabolic panel prior to discharge, mother is at bedside and is agreeable to the plan.  6:30 AM  The patient was reevaluated several times, she had a gradual return to baseline and at this time is awake, alert, ambulatory, tolerating oral fluids without nausea or vomiting.  Medical screening examination/treatment/procedure(s) were conducted as a shared visit with non-physician practitioner(s) and myself.  I personally evaluated the patient during the encounter.  Clinical Impression: Alcohol intoxication      Vida RollerBrian D Preslee Regas, MD 07/13/14 [phone]

## 2014-09-04 ENCOUNTER — Ambulatory Visit: Payer: Medicaid Other

## 2014-10-31 ENCOUNTER — Emergency Department (HOSPITAL_COMMUNITY)
Admission: EM | Admit: 2014-10-31 | Discharge: 2014-10-31 | Disposition: A | Discharge status: Home / Self Care | Payer: Medicaid Other | Attending: Emergency Medicine | Admitting: Emergency Medicine

## 2014-10-31 ENCOUNTER — Encounter (HOSPITAL_COMMUNITY): Payer: Self-pay | Admitting: Emergency Medicine

## 2014-10-31 ENCOUNTER — Telehealth: Payer: Self-pay | Admitting: Family Medicine

## 2014-10-31 DIAGNOSIS — L02412 Cutaneous abscess of left axilla: Secondary | ICD-10-CM | POA: Diagnosis not present

## 2014-10-31 DIAGNOSIS — Z8659 Personal history of other mental and behavioral disorders: Secondary | ICD-10-CM | POA: Diagnosis not present

## 2014-10-31 DIAGNOSIS — Z87828 Personal history of other (healed) physical injury and trauma: Secondary | ICD-10-CM | POA: Insufficient documentation

## 2014-10-31 DIAGNOSIS — Z720 Tobacco use: Secondary | ICD-10-CM | POA: Diagnosis not present

## 2014-10-31 MED ORDER — OXYCODONE-ACETAMINOPHEN 5-325 MG PO TABS: 1.0000 | ORAL_TABLET | ORAL | Status: DC | PRN

## 2014-10-31 MED ORDER — SULFAMETHOXAZOLE-TRIMETHOPRIM 800-160 MG PO TABS: 1.0000 | ORAL_TABLET | Freq: Two times a day (BID) | ORAL | Status: DC

## 2014-10-31 MED ORDER — LIDOCAINE-EPINEPHRINE 2 %-1:100000 IJ SOLN: 20.0000 mL | Freq: Once | INTRAMUSCULAR | Status: DC

## 2014-10-31 MED ORDER — LIDOCAINE-EPINEPHRINE 1 %-1:100000 IJ SOLN
30.0000 mL | Freq: Once | INTRAMUSCULAR | Status: AC
  Administered 2014-10-31: 30 mL
  Filled 2014-10-31: qty 1

## 2014-10-31 NOTE — ED Notes (Signed)
Pt has abscesses under left axilla since Friday; non draining. Has hx of these but go away on their own and not as severe.
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
## 2014-10-31 NOTE — Telephone Encounter (Signed)
Mother calling:  Bump in armpit; now cannot close her arm anymore Swelling and redness Streaking down her arm  No tachypnea or tachycardia But wonders what to do   Likely cellulitis needs imaging possible I&D and oral vs IV abx Advised ED; [facility]

## 2014-10-31 NOTE — Discharge Instructions (Signed)
Take the prescribed medication as directed. Follow-up with your primary care physician in 2 days for packing removal and wound check. Return to the ED for new or worsening symptoms.

## 2014-11-05 ENCOUNTER — Ambulatory Visit: Payer: Medicaid Other | Admitting: Family Medicine

## 2015-10-24 ENCOUNTER — Ambulatory Visit: Payer: Medicaid Other | Admitting: Obstetrics and Gynecology

## 2015-12-23 ENCOUNTER — Ambulatory Visit: Payer: Medicaid Other | Admitting: Internal Medicine

## 2015-12-23 ENCOUNTER — Ambulatory Visit: Payer: Medicaid Other | Admitting: Family Medicine

## 2015-12-24 ENCOUNTER — Ambulatory Visit: Payer: Medicaid Other | Admitting: Family Medicine

## 2016-01-02 ENCOUNTER — Ambulatory Visit (INDEPENDENT_AMBULATORY_CARE_PROVIDER_SITE_OTHER): Payer: Medicaid Other | Admitting: Family Medicine

## 2016-01-02 ENCOUNTER — Ambulatory Visit: Payer: Medicaid Other | Admitting: Family Medicine

## 2016-01-02 ENCOUNTER — Encounter: Payer: Self-pay | Admitting: Family Medicine

## 2016-01-02 VITALS — BP 122/82 | HR 90 | Temp 98.2°F | Wt 142.6 lb

## 2016-01-02 DIAGNOSIS — Z23 Encounter for immunization: Secondary | ICD-10-CM

## 2016-01-02 DIAGNOSIS — R21 Rash and other nonspecific skin eruption: Secondary | ICD-10-CM | POA: Insufficient documentation

## 2016-01-02 DIAGNOSIS — L309 Dermatitis, unspecified: Secondary | ICD-10-CM | POA: Insufficient documentation

## 2016-01-02 MED ORDER — TRIAMCINOLONE ACETONIDE 0.1 % EX CREA: 1.0000 "application " | TOPICAL_CREAM | Freq: Two times a day (BID) | CUTANEOUS | Status: DC

## 2016-01-02 NOTE — Progress Notes (Signed)
   Subjective:    Patient ID: Autumn Kaiser, female    DOB: 21-Apr-1991, 25 y.o.   MRN: [id number]  Seen for Same day visit for   CC: rash  RASH  Had rash for 14-21 days. Location: bilateral sides, back/ butt, legs Medications tried: cocoa butter, vaseline Similar rash in past: no New medications or antibiotics: No Tick, Insect or new pet exposure: Got sister's dog ~ 3 weeks ago, but dog is now gone Recent travel: no New detergent or soap: new laudry detergent and perfume Immunocompromised: no  Symptoms Itching: yes Pain over rash: mild Feeling ill all over: no Fever: no Mouth sores: no Face or tongue swelling: no Trouble breathing: no Joint swelling or pain: no  Review of Symptoms - see HPI PMH - Smoking status noted.     Objective:  BP 122/82 mmHg  Pulse 90  Temp(Src) 98.2 F (36.8 C) (Oral)  Wt 142 lb 9.6 oz (64.683 kg)  LMP 12/07/2015 (Exact Date)  General: NAD Cardiac: RRR, normal heart sounds, no murmurs. 2+ radial and PT pulses bilaterally Respiratory: CTAB, normal effort Abdomen: soft, nontender, nondistended, no hepatic or splenomegaly. Bowel sounds present Extremities: no edema or cyanosis. WWP. Skin: Diffuse dry hyperpigmented skin with secondary excoriation on lower back, elbows, knees and thighs. Also maculopapular rash on bilateral flanks    Assessment & Plan:   Rash and nonspecific skin eruption  Maculopapular rash on the sides for several weeks after pet sitting her sister's dog for a few week.  Also with diffuse dry, hyperpigmented skin in partial areas and lower back.  Most likely eczema but also could be contact / irritant dermatis related to new detergents and perfumes vs secondary to fleas for recent dog sitting -  Triamcinolone cream twice a day 1 week -  Recommended Vaseline to 3 times daily -  Follow-up in 1-2 weeks for reassessment   Contraceptive management She declines birth control today  Healthcare maintenance  Recommendations scheduling an appointment for Pap smear

## 2016-01-02 NOTE — Patient Instructions (Addendum)
It was great seeing you today.  Rash is most likely due to eczema, possibly made worse by your new laundry detergents or perfumes.   1. Recommend steroid cream (Triamcinolone 0.1%) twice a day for 1 week.  2. Also apply Vaseline to 3 times a day to dry skin   Please bring all your medications to every doctors visit  Sign up for My Chart to have easy access to your labs results, and communication with your Primary care physician.  Next Appointment  Please make an appointment with Dr Gayla Doss in 2 weeks for rash    Also make an appointment for your Pap smear and to discuss birth control  I look forward to talking with you again at our next visit. If you have any questions or concerns before then, please call the clinic at [phone].  Take Care,   Dr Wenda Low

## 2016-01-02 NOTE — Assessment & Plan Note (Signed)
Maculopapular rash on the sides for several weeks after pet sitting her sister's dog for a few week.  Also with diffuse dry, hyperpigmented skin in partial areas and lower back.  Most likely eczema but also could be contact / irritant dermatis related to new detergents and perfumes vs secondary to fleas for recent dog sitting -  Triamcinolone cream twice a day 1 week -  Recommended Vaseline to 3 times daily -  Follow-up in 1-2 weeks for reassessment

## 2016-01-02 NOTE — Assessment & Plan Note (Signed)
She declines birth control today

## 2016-01-02 NOTE — Assessment & Plan Note (Signed)
Recommendations scheduling an appointment for Pap smear

## 2016-01-09 ENCOUNTER — Ambulatory Visit: Payer: Medicaid Other | Admitting: Student

## 2016-01-09 ENCOUNTER — Ambulatory Visit: Payer: Medicaid Other | Admitting: Family Medicine

## 2016-08-23 ENCOUNTER — Encounter: Payer: Self-pay | Admitting: Family Medicine

## 2016-08-23 ENCOUNTER — Ambulatory Visit (INDEPENDENT_AMBULATORY_CARE_PROVIDER_SITE_OTHER): Payer: Medicaid Other | Admitting: Family Medicine

## 2016-08-23 DIAGNOSIS — H109 Unspecified conjunctivitis: Secondary | ICD-10-CM

## 2016-08-23 NOTE — Assessment & Plan Note (Addendum)
Patient is here with signs and symptoms most consistent with viral conjunctivitis. No evidence of vision changes or abnormalities. No evidence of optic neuritis. Clear tears at this time. Negative fluorescein exam. Differential would include bacterial etiology versus foreign body. Allergic conjunctivitis deemed highly unlikely due to the unilateral nature. Will treat conservatively. - Ibuprofen/Tylenol for discomfort/swelling. - Moist warm washcloth for symptomatic relief. - Reassurance provided - Informed that symptoms may progress to the right eye with time. - Return precautions discussed.

## 2016-08-23 NOTE — Patient Instructions (Signed)
Viral Conjunctivitis Viral conjunctivitis is an inflammation of the clear membrane that covers the white part of your eye and the inner surface of your eyelid (conjunctiva). The inflammation is caused by a viral infection. The blood vessels in the conjunctiva become inflamed, causing the eye to become red or pink, and often itchy. Viral conjunctivitis can easily be passed from one person to another (contagious). CAUSES  Viral conjunctivitis is caused by a virus. A virus is a type of contagious germ. It can be spread by touching objects that have been contaminated with the virus, such as doorknobs or towels.  SYMPTOMS  Symptoms of viral conjunctivitis may include:   Eye redness.  Tearing or watery eyes.  Itchy eyes.  Burning feeling in the eyes.  Clear drainage from the eye.  Swollen eyelids.  A gritty feeling in the eye.  Light sensitivity. DIAGNOSIS  Viral conjunctivitis may be diagnosed with a medical history and physical exam. If you have discharge from your eye, the discharge may be tested to rule out other causes of conjunctivitis.  TREATMENT  Viral conjunctivitis does not respond to medicines that kill bacteria (antibiotics). Treatment for viral conjunctivitis is directed at stopping a bacterial infection from developing in addition to the viral infection. Treatment also aims to relieve your symptoms, such as itching. This may be done with antihistamine drops or other eye medicines. HOME CARE INSTRUCTIONS  Take medicines only as directed by your health care provider.  Avoid touching or rubbing your eyes.  Apply a warm, clean washcloth to your eye for 10-20 minutes, 3-4 times per day.  If you wear contact lenses, do not wear them until the inflammation is gone and your health care provider says it is safe to wear them again. Ask your health care provider how to sterilize or replace your contact lenses before using them again. Wear glasses until you can resume wearing  contacts.  Avoid wearing eye makeup until the inflammation is gone. Throw away any old eye cosmetics that may be contaminated.  Change or wash your pillowcase every day.  Do not share towels or washcloths. This may spread the infection.  Wash your hands often with soap and water. Use paper towels to dry your hands.  Gently wipe away any drainage from your eye with a warm, wet washcloth or a cotton ball.  Be very careful to avoid touching the edge of the eyelid with the eye drop bottle or ointment tube when applying medicines to the affected eye. This will stop you from spreading the infection to the other eye or to other people. SEEK MEDICAL CARE IF:   Your symptoms do not improve with treatment.  You have increased pain.  Your vision becomes blurry.  You have a fever.  You have facial pain, redness, or swelling.  You have new symptoms.  Your symptoms get worse.   This information is not intended to replace advice given to you by your health care provider. Make sure you discuss any questions you have with your health care provider.   Document Released: 02/12/2003 Document Revised: 05/16/2006 Document Reviewed: 09/03/2014 Elsevier Interactive Patient Education 2016 [employer]  

## 2016-08-23 NOTE — Progress Notes (Signed)
EYE COMPLAINT Thinks she has pink eye (Left eye). Going on since Saturday. Felt like there was a piece of hair in her eye Saturday. Friday morning she noticed some eye redness. Some eye tearing.   Eye symptoms started 4 days ago. Eye involved: left Eye symptom progression: started red, then some discomfort (like something is in the eye). Other people with same problem: no Medications tried:  Only eye drops (OTC for eye redness) Eye Trauma: no Contact Lens: no Recent eye surgeries: no  Symptoms Itching: yes Eye discharge or mattering: some tearing, nothing white or thick Vision impairment: no Photophobia: no Nose discharge: no Sneezing: no Vomiting: no Rings around lights:  no  ROS see HPI Smoking Status noted  CC, SH/smoking status, and VS noted  Objective: BP 120/80   Pulse 91   Temp 98.5 F (36.9 C) (Oral)   Ht 5' 4.5" (1.638 m)   Wt 140 lb (63.5 kg)   LMP 08/23/2016   SpO2 99%   BMI 23.66 kg/m  Gen: NAD, alert, cooperative. CV: Well-perfused. Resp: Non-labored. Neuro: Sensation intact throughout. Visual fields intact bilaterally. HEENT: MMM, EOMI, PERRLA, significant left-sided scleral injection and conjunctival erythema with clear tearing. TMs clear bilaterally. No LAD. OP clear without evidence of exudate.  Fluorescein examination, left eye: No evidence of abrasion or ulceration present. No obvious foreign bodies. [Dr. Gwendolyn GrantWalden present for exam]   Assessment and plan:  Conjunctivitis Patient is here with signs and symptoms most consistent with viral conjunctivitis. No evidence of vision changes or abnormalities. No evidence of optic neuritis. Clear tears at this time. Negative fluorescein exam. Differential would include bacterial etiology versus foreign body. Allergic conjunctivitis deemed highly unlikely due to the unilateral nature. Will treat conservatively. - Ibuprofen/Tylenol for discomfort/swelling. - Moist warm washcloth for symptomatic relief. -  Reassurance provided - Informed that symptoms may progress to the right eye with time. - Return precautions discussed.   Kathee DeltonIan D Tykwon Fera, MD,MS,  PGY3 08/23/2016 6:15 PM

## 2017-05-03 ENCOUNTER — Encounter: Payer: Self-pay | Admitting: Family Medicine

## 2017-05-03 ENCOUNTER — Ambulatory Visit: Payer: Medicaid Other | Admitting: Family Medicine

## 2017-05-03 ENCOUNTER — Ambulatory Visit (INDEPENDENT_AMBULATORY_CARE_PROVIDER_SITE_OTHER): Payer: Medicaid Other | Admitting: Family Medicine

## 2017-05-03 ENCOUNTER — Inpatient Hospital Stay (HOSPITAL_COMMUNITY): Payer: Medicaid Other

## 2017-05-03 ENCOUNTER — Encounter (HOSPITAL_COMMUNITY): Payer: Self-pay | Admitting: Advanced Practice Midwife

## 2017-05-03 ENCOUNTER — Inpatient Hospital Stay (HOSPITAL_COMMUNITY)
Admission: AD | Admit: 2017-05-03 | Discharge: 2017-05-04 | Disposition: A | Discharge status: Home / Self Care | Payer: Medicaid Other | Source: Ambulatory Visit | Attending: Family Medicine | Admitting: Family Medicine

## 2017-05-03 VITALS — BP 124/80 | HR 78 | Temp 98.3°F | Wt 146.0 lb

## 2017-05-03 DIAGNOSIS — O3481 Maternal care for other abnormalities of pelvic organs, first trimester: Secondary | ICD-10-CM | POA: Insufficient documentation

## 2017-05-03 DIAGNOSIS — O99342 Other mental disorders complicating pregnancy, second trimester: Secondary | ICD-10-CM | POA: Diagnosis not present

## 2017-05-03 DIAGNOSIS — N83209 Unspecified ovarian cyst, unspecified side: Secondary | ICD-10-CM

## 2017-05-03 DIAGNOSIS — R109 Unspecified abdominal pain: Secondary | ICD-10-CM | POA: Diagnosis not present

## 2017-05-03 DIAGNOSIS — F209 Schizophrenia, unspecified: Secondary | ICD-10-CM | POA: Insufficient documentation

## 2017-05-03 DIAGNOSIS — R102 Pelvic and perineal pain: Secondary | ICD-10-CM | POA: Insufficient documentation

## 2017-05-03 DIAGNOSIS — F319 Bipolar disorder, unspecified: Secondary | ICD-10-CM | POA: Insufficient documentation

## 2017-05-03 DIAGNOSIS — Z87891 Personal history of nicotine dependence: Secondary | ICD-10-CM | POA: Diagnosis not present

## 2017-05-03 DIAGNOSIS — N83201 Unspecified ovarian cyst, right side: Secondary | ICD-10-CM | POA: Insufficient documentation

## 2017-05-03 DIAGNOSIS — O9989 Other specified diseases and conditions complicating pregnancy, childbirth and the puerperium: Secondary | ICD-10-CM | POA: Diagnosis not present

## 2017-05-03 DIAGNOSIS — Z3A01 Less than 8 weeks gestation of pregnancy: Secondary | ICD-10-CM | POA: Diagnosis not present

## 2017-05-03 DIAGNOSIS — O26891 Other specified pregnancy related conditions, first trimester: Secondary | ICD-10-CM | POA: Diagnosis not present

## 2017-05-03 DIAGNOSIS — Z3491 Encounter for supervision of normal pregnancy, unspecified, first trimester: Secondary | ICD-10-CM

## 2017-05-03 DIAGNOSIS — N83202 Unspecified ovarian cyst, left side: Secondary | ICD-10-CM | POA: Insufficient documentation

## 2017-05-03 DIAGNOSIS — Z3200 Encounter for pregnancy test, result unknown: Secondary | ICD-10-CM

## 2017-05-03 LAB — POCT URINE PREGNANCY: Preg Test, Ur: POSITIVE — AB

## 2017-05-03 LAB — URINALYSIS, ROUTINE W REFLEX MICROSCOPIC
Bilirubin Urine: NEGATIVE
GLUCOSE, UA: NEGATIVE mg/dL
Hgb urine dipstick: NEGATIVE
Ketones, ur: 20 mg/dL — AB
LEUKOCYTES UA: NEGATIVE
NITRITE: NEGATIVE
PH: 6 (ref 5.0–8.0)
Protein, ur: NEGATIVE mg/dL
Specific Gravity, Urine: 1.018 (ref 1.005–1.030)

## 2017-05-03 LAB — CBC
HCT: 37.7 % (ref 36.0–46.0)
Hemoglobin: 12.5 g/dL (ref 12.0–15.0)
MCH: 29.7 pg (ref 26.0–34.0)
MCHC: 33.2 g/dL (ref 30.0–36.0)
MCV: 89.5 fL (ref 78.0–100.0)
PLATELETS: 244 10*3/uL (ref 150–400)
RBC: 4.21 MIL/uL (ref 3.87–5.11)
RDW: 13.1 % (ref 11.5–15.5)
WBC: 3 10*3/uL — ABNORMAL LOW (ref 4.0–10.5)

## 2017-05-03 LAB — WET PREP, GENITAL
Clue Cells Wet Prep HPF POC: NONE SEEN
SPERM: NONE SEEN
Trich, Wet Prep: NONE SEEN
WBC, Wet Prep HPF POC: NONE SEEN
YEAST WET PREP: NONE SEEN

## 2017-05-03 LAB — POCT PREGNANCY, URINE: Preg Test, Ur: POSITIVE — AB

## 2017-05-03 NOTE — Patient Instructions (Signed)
Prenatal Care WHAT IS PRENATAL CARE? Prenatal care is the process of caring for a pregnant woman before she gives birth. Prenatal care makes sure that she and her baby remain as healthy as possible throughout pregnancy. Prenatal care may be provided by a midwife, family practice health care provider, or a childbirth and pregnancy specialist (obstetrician). Prenatal care may include physical examinations, testing, treatments, and education on nutrition, lifestyle, and social support services. WHY IS PRENATAL CARE SO IMPORTANT? Early and consistent prenatal care increases the chance that you and your baby will remain healthy throughout your pregnancy. This type of care also decreases a baby's risk of being born too early (prematurely), or being born smaller than expected (small for gestational age). Any underlying medical conditions you may have that could pose a risk during your pregnancy are discussed during prenatal care visits. You will also be monitored regularly for any new conditions that may arise during your pregnancy so they can be treated quickly and effectively. WHAT HAPPENS DURING PRENATAL CARE VISITS? Prenatal care visits may include the following: Discussion Tell your health care provider about any new signs or symptoms you have experienced since your last visit. These might include:  Nausea or vomiting.  Increased or decreased level of energy.  Difficulty sleeping.  Back or leg pain.  Weight changes.  Frequent urination.  Shortness of breath with physical activity.  Changes in your skin, such as the development of a rash or itchiness.  Vaginal discharge or bleeding.  Feelings of excitement or nervousness.  Changes in your baby's movements. You may want to write down any questions or topics you want to discuss with your health care provider and bring them with you to your appointment. Examination During your first prenatal care visit, you will likely have a complete  physical exam. Your health care provider will often examine your vagina, cervix, and the position of your uterus, as well as check your heart, lungs, and other body systems. As your pregnancy progresses, your health care provider will measure the size of your uterus and your baby's position inside your uterus. He or she may also examine you for early signs of labor. Your prenatal visits may also include checking your blood pressure and, after about 10-12 weeks of pregnancy, listening to your baby's heartbeat. Testing Regular testing often includes:  Urinalysis. This checks your urine for glucose, protein, or signs of infection.  Blood count. This checks the levels of white and red blood cells in your body.  Tests for sexually transmitted infections (STIs). Testing for STIs at the beginning of pregnancy is routinely done and is required in many states.  Antibody testing. You will be checked to see if you are immune to certain illnesses, such as rubella, that can affect a developing fetus.  Glucose screen. Around 24-28 weeks of pregnancy, your blood glucose level will be checked for signs of gestational diabetes. Follow-up tests may be recommended.  Group B strep. This is a bacteria that is commonly found inside a woman's vagina. This test will inform your health care provider if you need an antibiotic to reduce the amount of this bacteria in your body prior to labor and childbirth.  Ultrasound. Many pregnant women undergo an ultrasound screening around 18-20 weeks of pregnancy to evaluate the health of the fetus and check for any developmental abnormalities.  HIV (human immunodeficiency virus) testing. Early in your pregnancy, you will be screened for HIV. If you are at high risk for HIV, this test may be  repeated during your third trimester of pregnancy. You may be offered other testing based on your age, personal or family medical history, or other factors. HOW OFTEN SHOULD I PLAN TO SEE MY  HEALTH CARE PROVIDER FOR PRENATAL CARE? Your prenatal care check-up schedule depends on any medical conditions you have before, or develop during, your pregnancy. If you do not have any underlying medical conditions, you will likely be seen for checkups:  Monthly, during the first 6 months of pregnancy.  Twice a month during months 7 and 8 of pregnancy.  Weekly starting in the 9th month of pregnancy and until delivery. If you develop signs of early labor or other concerning signs or symptoms, you may need to see your health care provider more often. Ask your health care provider what prenatal care schedule is best for you. WHAT CAN I DO TO KEEP MYSELF AND MY BABY AS HEALTHY AS POSSIBLE DURING MY PREGNANCY?  Take a prenatal vitamin containing 400 micrograms (0.4 mg) of folic acid every day. Your health care provider may also ask you to take additional vitamins such as iodine, vitamin D, iron, copper, and zinc.  Take 1500-2000 mg of calcium daily starting at your 20th week of pregnancy until you deliver your baby.  Make sure you are up to date on your vaccinations. Unless directed otherwise by your health care provider:  You should receive a tetanus, diphtheria, and pertussis (Tdap) vaccination between the 27th and 36th week of your pregnancy, regardless of when your last Tdap immunization occurred. This helps protect your baby from whooping cough (pertussis) after he or she is born.  You should receive an annual inactivated influenza vaccine (IIV) to help protect you and your baby from influenza. This can be done at any point during your pregnancy.  Eat a well-rounded diet that includes:  Fresh fruits and vegetables.  Lean proteins.  Calcium-rich foods such as milk, yogurt, hard cheeses, and dark, leafy greens.  Whole grain breads.  Do noteat seafood high in mercury, including:  Swordfish.  Tilefish.  Shark.  King mackerel.  More than 6 oz tuna per week.  Do not eat:  Raw  or undercooked meats or eggs.  Unpasteurized foods, such as soft cheeses (brie, blue, or feta), juices, and milks.  Lunch meats.  Hot dogs that have not been heated until they are steaming.  Drink enough water to keep your urine clear or pale yellow. For many women, this may be 10 or more 8 oz glasses of water each day. Keeping yourself hydrated helps deliver nutrients to your baby and may prevent the start of pre-term uterine contractions.  Do not use any tobacco products including cigarettes, chewing tobacco, or electronic cigarettes. If you need help quitting, ask your health care provider.  Do not drink beverages containing alcohol. No safe level of alcohol consumption during pregnancy has been determined.  Do not use any illegal drugs. These can harm your developing baby or cause a miscarriage.  Ask your health care provider or pharmacist before taking any prescription or over-the-counter medicines, herbs, or supplements.  Limit your caffeine intake to no more than 200 mg per day.  Exercise. Unless told otherwise by your health care provider, try to get 30 minutes of moderate exercise most days of the week. Do not  do high-impact activities, contact sports, or activities with a high risk of falling, such as horseback riding or downhill skiing.  Get plenty of rest.  Avoid anything that raises your body temperature, such  as hot tubs and saunas.  If you own a cat, do not empty its litter box. Bacteria contained in cat feces can cause an infection called toxoplasmosis. This can result in serious harm to the fetus.  Stay away from chemicals such as insecticides, lead, mercury, and cleaning or paint products that contain solvents.  Do not have any X-rays taken unless medically necessary.  Take a childbirth and breastfeeding preparation class. Ask your health care provider if you need a referral or recommendation. This information is not intended to replace advice given to you by your  health care provider. Make sure you discuss any questions you have with your health care provider. Document Released: 11/25/2003 Document Revised: 04/26/2016 Document Reviewed: 02/06/2014 Elsevier Interactive Patient Education  2017 [employer].   First Trimester of Pregnancy The first trimester of pregnancy is from week 1 until the end of week 13 (months 1 through 3). A week after a sperm fertilizes an egg, the egg will implant on the wall of the uterus. This embryo will begin to develop into a baby. Genes from you and your partner will form the baby. The female genes will determine whether the baby will be a boy or a girl. At 6-8 weeks, the eyes and face will be formed, and the heartbeat can be seen on ultrasound. At the end of 12 weeks, all the baby's organs will be formed. Now that you are pregnant, you will want to do everything you can to have a healthy baby. Two of the most important things are to get good prenatal care and to follow your health care provider's instructions. Prenatal care is all the medical care you receive before the baby's birth. This care will help prevent, find, and treat any problems during the pregnancy and childbirth. Body changes during your first trimester Your body goes through many changes during pregnancy. The changes vary from woman to woman.  You may gain or lose a couple of pounds at first.  You may feel sick to your stomach (nauseous) and you may throw up (vomit). If the vomiting is uncontrollable, call your health care provider.  You may tire easily.  You may develop headaches that can be relieved by medicines. All medicines should be approved by your health care provider.  You may urinate more often. Painful urination may mean you have a bladder infection.  You may develop heartburn as a result of your pregnancy.  You may develop constipation because certain hormones are causing the muscles that push stool through your intestines to slow down.  You  may develop hemorrhoids or swollen veins (varicose veins).  Your breasts may begin to grow larger and become tender. Your nipples may stick out more, and the tissue that surrounds them (areola) may become darker.  Your gums may bleed and may be sensitive to brushing and flossing.  Dark spots or blotches (chloasma, mask of pregnancy) may develop on your face. This will likely fade after the baby is born.  Your menstrual periods will stop.  You may have a loss of appetite.  You may develop cravings for certain kinds of food.  You may have changes in your emotions from day to day, such as being excited to be pregnant or being concerned that something may go wrong with the pregnancy and baby.  You may have more vivid and strange dreams.  You may have changes in your hair. These can include thickening of your hair, rapid growth, and changes in texture. Some women also  have hair loss during or after pregnancy, or hair that feels dry or thin. Your hair will most likely return to normal after your baby is born. What to expect at prenatal visits During a routine prenatal visit:  You will be weighed to make sure you and the baby are growing normally.  Your blood pressure will be taken.  Your abdomen will be measured to track your baby's growth.  The fetal heartbeat will be listened to between weeks 10 and 14 of your pregnancy.  Test results from any previous visits will be discussed. Your health care provider may ask you:  How you are feeling.  If you are feeling the baby move.  If you have had any abnormal symptoms, such as leaking fluid, bleeding, severe headaches, or abdominal cramping.  If you are using any tobacco products, including cigarettes, chewing tobacco, and electronic cigarettes.  If you have any questions. Other tests that may be performed during your first trimester include:  Blood tests to find your blood type and to check for the presence of any previous  infections. The tests will also be used to check for low iron levels (anemia) and protein on red blood cells (Rh antibodies). Depending on your risk factors, or if you previously had diabetes during pregnancy, you may have tests to check for high blood sugar that affects pregnant women (gestational diabetes).  Urine tests to check for infections, diabetes, or protein in the urine.  An ultrasound to confirm the proper growth and development of the baby.  Fetal screens for spinal cord problems (spina bifida) and Down syndrome.  HIV (human immunodeficiency virus) testing. Routine prenatal testing includes screening for HIV, unless you choose not to have this test.  You may need other tests to make sure you and the baby are doing well. Follow these instructions at home: Medicines   Follow your health care provider's instructions regarding medicine use. Specific medicines may be either safe or unsafe to take during pregnancy.  Take a prenatal vitamin that contains at least 600 micrograms (mcg) of folic acid.  If you develop constipation, try taking a stool softener if your health care provider approves. Eating and drinking   Eat a balanced diet that includes fresh fruits and vegetables, whole grains, good sources of protein such as meat, eggs, or tofu, and low-fat dairy. Your health care provider will help you determine the amount of weight gain that is right for you.  Avoid raw meat and uncooked cheese. These carry germs that can cause birth defects in the baby.  Eating four or five small meals rather than three large meals a day may help relieve nausea and vomiting. If you start to feel nauseous, eating a few soda crackers can be helpful. Drinking liquids between meals, instead of during meals, also seems to help ease nausea and vomiting.  Limit foods that are high in fat and processed sugars, such as fried and sweet foods.  To prevent constipation:  Eat foods that are high in fiber, such  as fresh fruits and vegetables, whole grains, and beans.  Drink enough fluid to keep your urine clear or pale yellow. Activity   Exercise only as directed by your health care provider. Most women can continue their usual exercise routine during pregnancy. Try to exercise for 30 minutes at least 5 days a week. Exercising will help you:  Control your weight.  Stay in shape.  Be prepared for labor and delivery.  Experiencing pain or cramping in the lower  abdomen or lower back is a good sign that you should stop exercising. Check with your health care provider before continuing with normal exercises.  Try to avoid standing for long periods of time. Move your legs often if you must stand in one place for a long time.  Avoid heavy lifting.  Wear low-heeled shoes and practice good posture.  You may continue to have sex unless your health care provider tells you not to. Relieving pain and discomfort   Wear a good support bra to relieve breast tenderness.  Take warm sitz baths to soothe any pain or discomfort caused by hemorrhoids. Use hemorrhoid cream if your health care provider approves.  Rest with your legs elevated if you have leg cramps or low back pain.  If you develop varicose veins in your legs, wear support hose. Elevate your feet for 15 minutes, 3-4 times a day. Limit salt in your diet. Prenatal care   Schedule your prenatal visits by the twelfth week of pregnancy. They are usually scheduled monthly at first, then more often in the last 2 months before delivery.  Write down your questions. Take them to your prenatal visits.  Keep all your prenatal visits as told by your health care provider. This is important. Safety   Wear your seat belt at all times when driving.  Make a list of emergency phone numbers, including numbers for family, friends, the hospital, and police and fire departments. General instructions   Ask your health care provider for a referral to a local  prenatal education class. Begin classes no later than the beginning of month 6 of your pregnancy.  Ask for help if you have counseling or nutritional needs during pregnancy. Your health care provider can offer advice or refer you to specialists for help with various needs.  Do not use hot tubs, steam rooms, or saunas.  Do not douche or use tampons or scented sanitary pads.  Do not cross your legs for long periods of time.  Avoid cat litter boxes and soil used by cats. These carry germs that can cause birth defects in the baby and possibly loss of the fetus by miscarriage or stillbirth.  Avoid all smoking, herbs, alcohol, and medicines not prescribed by your health care provider. Chemicals in these products affect the formation and growth of the baby.  Do not use any products that contain nicotine or tobacco, such as cigarettes and e-cigarettes. If you need help quitting, ask your health care provider. You may receive counseling support and other resources to help you quit.  Schedule a dentist appointment. At home, brush your teeth with a soft toothbrush and be gentle when you floss. Contact a health care provider if:  You have dizziness.  You have mild pelvic cramps, pelvic pressure, or nagging pain in the abdominal area.  You have persistent nausea, vomiting, or diarrhea.  You have a bad smelling vaginal discharge.  You have pain when you urinate.  You notice increased swelling in your face, hands, legs, or ankles.  You are exposed to fifth disease or chickenpox.  You are exposed to [place] measles (rubella) and have never had it. Get help right away if:  You have a fever.  You are leaking fluid from your vagina.  You have spotting or bleeding from your vagina.  You have severe abdominal cramping or pain.  You have rapid weight gain or loss.  You vomit blood or material that looks like coffee grounds.  You develop a severe headache.  You have shortness of  breath.  You have any kind of trauma, such as from a fall or a car accident. Summary  The first trimester of pregnancy is from week 1 until the end of week 13 (months 1 through 3).  Your body goes through many changes during pregnancy. The changes vary from woman to woman.  You will have routine prenatal visits. During those visits, your health care provider will examine you, discuss any test results you may have, and talk with you about how you are feeling. This information is not intended to replace advice given to you by your health care provider. Make sure you discuss any questions you have with your health care provider. Document Released: 11/16/2001 Document Revised: 11/03/2016 Document Reviewed: 11/03/2016 Elsevier Interactive Patient Education  2017 [employer].
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
## 2017-05-03 NOTE — MAU Note (Signed)
Pt here with c/o missed period and stomach pains for last two days. Did HPT today was positive. Went to office today and was told to make an appointment to be seen, scheduled for Thursday of this week.
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
## 2017-05-04 DIAGNOSIS — R109 Unspecified abdominal pain: Secondary | ICD-10-CM

## 2017-05-04 DIAGNOSIS — O9989 Other specified diseases and conditions complicating pregnancy, childbirth and the puerperium: Secondary | ICD-10-CM | POA: Diagnosis not present

## 2017-05-04 LAB — HCG, QUANTITATIVE, PREGNANCY: hCG, Beta Chain, Quant, S: 34465 m[IU]/mL — ABNORMAL HIGH (ref <5)

## 2017-05-04 LAB — GC/CHLAMYDIA PROBE AMP (~~LOC~~) NOT AT ARMC
CHLAMYDIA, DNA PROBE: NEGATIVE
Neisseria Gonorrhea: NEGATIVE

## 2017-05-04 LAB — ABO/RH: ABO/RH(D): O POS

## 2017-05-04 LAB — HIV ANTIBODY (ROUTINE TESTING W REFLEX): HIV Screen 4th Generation wRfx: NONREACTIVE

## 2017-06-16 ENCOUNTER — Ambulatory Visit (HOSPITAL_COMMUNITY)
Admission: RE | Admit: 2017-06-16 | Discharge: 2017-06-16 | Disposition: A | Discharge status: Home / Self Care | Payer: Medicaid Other | Source: Ambulatory Visit | Attending: Medical | Admitting: Medical

## 2017-06-16 ENCOUNTER — Ambulatory Visit: Payer: Self-pay

## 2017-06-16 ENCOUNTER — Ambulatory Visit (INDEPENDENT_AMBULATORY_CARE_PROVIDER_SITE_OTHER): Payer: Medicaid Other | Admitting: Medical

## 2017-06-16 ENCOUNTER — Encounter: Payer: Self-pay | Admitting: Medical

## 2017-06-16 VITALS — BP 131/81 | HR 78 | Wt 145.9 lb

## 2017-06-16 DIAGNOSIS — O021 Missed abortion: Secondary | ICD-10-CM

## 2017-06-16 DIAGNOSIS — O3680X0 Pregnancy with inconclusive fetal viability, not applicable or unspecified: Secondary | ICD-10-CM | POA: Diagnosis present

## 2017-06-16 DIAGNOSIS — Z3400 Encounter for supervision of normal first pregnancy, unspecified trimester: Secondary | ICD-10-CM | POA: Insufficient documentation

## 2017-06-16 LAB — POCT URINALYSIS DIP (DEVICE)
GLUCOSE, UA: NEGATIVE mg/dL
Hgb urine dipstick: NEGATIVE
LEUKOCYTES UA: NEGATIVE
Nitrite: NEGATIVE
Protein, ur: 30 mg/dL — AB
Specific Gravity, Urine: 1.02 (ref 1.005–1.030)
Urobilinogen, UA: 1 mg/dL (ref 0.0–1.0)
pH: 7 (ref 5.0–8.0)

## 2017-06-16 MED ORDER — IBUPROFEN 600 MG PO TABS: 600.0000 mg | ORAL_TABLET | Freq: Four times a day (QID) | ORAL | 1 refills | Status: DC | PRN

## 2017-06-16 MED ORDER — OXYCODONE-ACETAMINOPHEN 5-325 MG PO TABS: 1.0000 | ORAL_TABLET | ORAL | 0 refills | Status: DC | PRN

## 2017-06-16 MED ORDER — ONDANSETRON HCL 4 MG PO TABS: 4.0000 mg | ORAL_TABLET | Freq: Three times a day (TID) | ORAL | 0 refills | Status: DC | PRN

## 2017-06-16 MED ORDER — MISOPROSTOL 200 MCG PO TABS: 800.0000 ug | ORAL_TABLET | Freq: Once | ORAL | 1 refills | Status: DC

## 2017-06-16 NOTE — Addendum Note (Signed)
Addended by: Cleda ClarksMUMAW, ELIZABETH W on: 06/16/2017 01:21 PM   Modules accepted: Orders

## 2017-06-16 NOTE — Patient Instructions (Addendum)
Miscarriage A miscarriage is the loss of an unborn baby (fetus) before the 20th week of pregnancy. The cause is often unknown. Follow these instructions at home:  You may need to stay in bed (bed rest), or you may be able to do light activity. Go about activity as told by your doctor.  Have help at home.  Write down how many pads you use each day. Write down how soaked they are.  Do not use tampons. Do not wash out your vagina (douche) or have sex (intercourse) until your doctor approves.  Only take medicine as told by your doctor.  Do not take aspirin.  Keep all doctor visits as told.  If you or your partner have problems with grieving, talk to your doctor. You can also try counseling. Give yourself time to grieve before trying to get pregnant again. Get help right away if:  You have bad cramps or pain in your back or belly (abdomen).  You have a fever.  You pass large clumps of blood (clots) from your vagina that are walnut-sized or larger. Save the clumps for your doctor to see.  You pass large amounts of tissue from your vagina. Save the tissue for your doctor to see.  You have more bleeding.  You have thick, bad-smelling fluid (discharge) coming from the vagina.  You get lightheaded, weak, or you pass out (faint).  You have chills. This information is not intended to replace advice given to you by your health care provider. Make sure you discuss any questions you have with your health care provider. Document Released: 02/14/2012 Document Revised: 04/29/2016 Document Reviewed: 12/23/2011 Elsevier Interactive Patient Education  2017 Elsevier Inc.   Misoprostol tablets What is this medicine? MISOPROSTOL (mye soe PROST ole) helps to prevent stomach ulcers in patients who take medicines like ibuprofen and aspirin and who are at high risk of complications from ulcers. This medicine may be used for other purposes; ask your health care provider or pharmacist if you have  questions. COMMON BRAND NAME(S): Cytotec What should I tell my health care provider before I take this medicine? They need to know if you have any of these conditions: -Crohn's disease -heart disease -kidney disease -ulcerative colitis -an unusual or allergic reaction to misoprostol, prostaglandins, other medicines, foods, dyes, or preservatives -pregnant or trying to get pregnant -breast-feeding How should I use this medicine? Take this medicine by mouth with a full glass of water. Follow the directions on the prescription label. Take this medicine with food.Take your medicine at regular intervals. Do not take your medicine more often than directed. Talk to your pediatrician regarding the use of this medicine in children. Special care may be needed. Overdosage: If you think you have taken too much of this medicine contact a poison control center or emergency room at once. NOTE: This medicine is only for you. Do not share this medicine with others. What if I miss a dose? If you miss a dose, take it as soon as you can. If it is almost time for your next dose, take only that dose. Do not take double or extra doses. What may interact with this medicine? -antacids This list may not describe all possible interactions. Give your health care provider a list of all the medicines, herbs, non-prescription drugs, or dietary supplements you use. Also tell them if you smoke, drink alcohol, or use illegal drugs. Some items may interact with your medicine. What should I watch for while using this medicine? Do not smoke  cigarettes or drink alcohol. These increase irritation to your stomach and can make it more susceptible to damage from medicine like ibuprofen and aspirin. If you are female, do not use this medicine if you are pregnant. Do not get pregnant while taking this medicine and for at least one month (one full menstrual cycle) after stopping this medicine. If you can become pregnant, use a reliable  form of birth control while taking this medicine. Talk to your doctor about birth control options. If you do become pregnant, think you are pregnant, or want to become pregnant, immediately call your doctor for advice. What side effects may I notice from receiving this medicine? Side effects that you should report to your doctor or health care professional as soon as possible: -allergic reactions like skin rash, itching or hives, swelling of the face, lips, or tongue -chest pain -fainting spells -severe diarrhea -sudden shortness of breath -unusual vaginal bleeding, pelvic pain, or cramping Side effects that usually do not require medical attention (report to your doctor or health care professional if they continue or are bothersome): -dizziness -headache -menstrual irregularity, spotting, or cramps -mild diarrhea -nausea -stomach upset or cramps This list may not describe all possible side effects. Call your doctor for medical advice about side effects. You may report side effects to FDA at 1-800-FDA-1088. Where should I keep my medicine? Keep out of the reach of children. Store at room temperature below 25 degrees C (77 degrees F). Keep in a dry place. Protect from moisture. Throw away any unused medicine after the expiration date. NOTE: This sheet is a summary. It may not cover all possible information. If you have questions about this medicine, talk to your doctor, pharmacist, or health care provider.  2018 Elsevier/Gold Standard (2008-11-05 10:59:53)

## 2017-06-16 NOTE — Addendum Note (Signed)
Addended by: Cleda ClarksMUMAW, ELIZABETH W on: 06/16/2017 01:14 PM   Modules accepted: Orders, Level of Service
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
## 2017-06-16 NOTE — Progress Notes (Signed)
Ms. Autumn Kaiser Autumn Kaiser presents to [facility] today for her first prenatal visit. Unable to detect FHR with doppler. Limited OB US performed by Diane Day, RN as noted above. Patient sent to Radiology for confirmatory scan. Dr. Erin FullingHarraway-Smith notified of patient.   Autumn Kaiser, Autumn N, PA-C 06/16/2017 11:09 AM

## 2017-06-16 NOTE — Progress Notes (Signed)
Pt informed that the ultrasound is considered a limited OB ultrasound and is not intended to be a complete ultrasound exam.  Patient also informed that the ultrasound is not being completed with the intent of assessing for fetal or placental anomalies or any pelvic abnormalities.  Explained that the purpose of today's ultrasound is to assess for viability.  Patient acknowledges the purpose of the exam and the limitations of the study.    Single IUP visualized Cardiac activity absent Vonzella NippleJulie Wenzel, PA notified Pt taken to US dept for confirmatory scan

## 2017-06-16 NOTE — Addendum Note (Signed)
Addended by: Faythe CasaBELLAMY, JEANETTA M on: 06/16/2017 01:10 PM   Modules accepted: Orders

## 2017-06-17 ENCOUNTER — Telehealth: Payer: Self-pay | Admitting: Family Medicine

## 2017-06-17 LAB — CBC
HEMOGLOBIN: 13.9 g/dL (ref 11.1–15.9)
Hematocrit: 41 % (ref 34.0–46.6)
MCH: 29.3 pg (ref 26.6–33.0)
MCHC: 33.9 g/dL (ref 31.5–35.7)
MCV: 86 fL (ref 79–97)
PLATELETS: 299 10*3/uL (ref 150–379)
RBC: 4.75 x10E6/uL (ref 3.77–5.28)
RDW: 13.2 % (ref 12.3–15.4)
WBC: 3.8 10*3/uL (ref 3.4–10.8)

## 2017-06-17 LAB — ABO AND RH: RH TYPE: POSITIVE

## 2017-06-17 LAB — BETA HCG QUANT (REF LAB): HCG QUANT: 1482 m[IU]/mL

## 2017-06-17 NOTE — Telephone Encounter (Signed)
Patient requesting a call if she can take a shower.

## 2017-06-18 LAB — URINE CULTURE, OB REFLEX

## 2017-06-18 LAB — CULTURE, OB URINE

## 2017-06-27 ENCOUNTER — Ambulatory Visit: Payer: Medicaid Other | Admitting: Certified Nurse Midwife

## 2017-07-05 NOTE — Telephone Encounter (Signed)
I called Rifky back and left a message I was returning her call - please call us back if you still have a question.

## 2017-07-08 ENCOUNTER — Ambulatory Visit: Payer: Medicaid Other | Admitting: Obstetrics & Gynecology

## 2017-07-24 ENCOUNTER — Encounter (HOSPITAL_COMMUNITY): Payer: Self-pay

## 2017-07-24 DIAGNOSIS — O9989 Other specified diseases and conditions complicating pregnancy, childbirth and the puerperium: Secondary | ICD-10-CM | POA: Diagnosis not present

## 2017-07-24 DIAGNOSIS — R1030 Lower abdominal pain, unspecified: Secondary | ICD-10-CM | POA: Insufficient documentation

## 2017-07-24 DIAGNOSIS — Z5321 Procedure and treatment not carried out due to patient leaving prior to being seen by health care provider: Secondary | ICD-10-CM | POA: Diagnosis not present

## 2017-07-24 DIAGNOSIS — R102 Pelvic and perineal pain: Secondary | ICD-10-CM | POA: Diagnosis not present

## 2017-07-24 DIAGNOSIS — R51 Headache: Secondary | ICD-10-CM | POA: Diagnosis not present

## 2017-07-24 DIAGNOSIS — R110 Nausea: Secondary | ICD-10-CM | POA: Diagnosis not present

## 2017-07-24 NOTE — ED Triage Notes (Signed)
Pt reports lower abdominal pain, headache and nausea since July 13 when she had a miscarriage. She states that she has been unable to keep her follow up Dr appts d/t transportation. A&Ox4. Denies vomiting, diarrhea, vaginal bleeding, or vaginal discharge.

## 2017-07-25 ENCOUNTER — Emergency Department (HOSPITAL_COMMUNITY)
Admission: EM | Admit: 2017-07-25 | Discharge: 2017-07-25 | Discharge status: Against Medical Advice | Payer: Medicaid Other | Attending: Emergency Medicine | Admitting: Emergency Medicine

## 2017-07-25 ENCOUNTER — Inpatient Hospital Stay (EMERGENCY_DEPARTMENT_HOSPITAL)
Admission: AD | Admit: 2017-07-25 | Discharge: 2017-07-26 | Disposition: A | Discharge status: Home / Self Care | Payer: Medicaid Other | Source: Ambulatory Visit | Attending: Obstetrics and Gynecology | Admitting: Obstetrics and Gynecology

## 2017-07-25 ENCOUNTER — Encounter (HOSPITAL_COMMUNITY): Payer: Self-pay | Admitting: *Deleted

## 2017-07-25 DIAGNOSIS — K5900 Constipation, unspecified: Secondary | ICD-10-CM

## 2017-07-25 DIAGNOSIS — R102 Pelvic and perineal pain: Secondary | ICD-10-CM

## 2017-07-25 LAB — URINALYSIS, ROUTINE W REFLEX MICROSCOPIC
BILIRUBIN URINE: NEGATIVE
Glucose, UA: NEGATIVE mg/dL
HGB URINE DIPSTICK: NEGATIVE
KETONES UR: NEGATIVE mg/dL
Leukocytes, UA: NEGATIVE
Nitrite: NEGATIVE
PROTEIN: NEGATIVE mg/dL
SPECIFIC GRAVITY, URINE: 1.001 — AB (ref 1.005–1.030)
pH: 6 (ref 5.0–8.0)

## 2017-07-25 LAB — CBC
HEMATOCRIT: 37.1 % (ref 36.0–46.0)
Hemoglobin: 12 g/dL (ref 12.0–15.0)
MCH: 28.6 pg (ref 26.0–34.0)
MCHC: 32.3 g/dL (ref 30.0–36.0)
MCV: 88.5 fL (ref 78.0–100.0)
PLATELETS: 272 10*3/uL (ref 150–400)
RBC: 4.19 MIL/uL (ref 3.87–5.11)
RDW: 14.6 % (ref 11.5–15.5)
WBC: 3.1 10*3/uL — AB (ref 4.0–10.5)

## 2017-07-25 LAB — POCT PREGNANCY, URINE: PREG TEST UR: NEGATIVE

## 2017-07-25 NOTE — MAU Note (Signed)
PT SAYS SHE HAD SAB ON 7-12-    IN CLINIC  - PLAN -  WAS   APPPOINTMENT  FOR FOLLOW-UP.      NO CYCLE  SINCE SAB.  NO BIRTH CONTROL.  LAST SEX- 8-3.   HAS LOWER  CRAMPS - STARTED  SAT-  WENT   TO [facility]  LAST NIGHT - WHILE  WAITING  THE LIGHTS IN THE LOBBY GAVE HER A H/A - SO  SHE LEFT.     STILL HAS CRAMPS.
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
## 2017-07-26 DIAGNOSIS — O9989 Other specified diseases and conditions complicating pregnancy, childbirth and the puerperium: Secondary | ICD-10-CM

## 2017-07-26 DIAGNOSIS — R102 Pelvic and perineal pain: Secondary | ICD-10-CM

## 2017-07-26 LAB — WET PREP, GENITAL
SPERM: NONE SEEN
TRICH WET PREP: NONE SEEN
WBC, Wet Prep HPF POC: NONE SEEN
Yeast Wet Prep HPF POC: NONE SEEN

## 2017-07-26 LAB — HCG, QUANTITATIVE, PREGNANCY: HCG, BETA CHAIN, QUANT, S: 1 m[IU]/mL (ref <5)

## 2017-07-26 LAB — GC/CHLAMYDIA PROBE AMP (~~LOC~~) NOT AT ARMC
CHLAMYDIA, DNA PROBE: NEGATIVE
NEISSERIA GONORRHEA: NEGATIVE

## 2017-07-26 LAB — HIV ANTIBODY (ROUTINE TESTING W REFLEX): HIV Screen 4th Generation wRfx: NONREACTIVE

## 2017-07-26 LAB — RPR: RPR: NONREACTIVE

## 2017-07-26 MED ORDER — DOCUSATE SODIUM 100 MG PO CAPS: 100.0000 mg | ORAL_CAPSULE | Freq: Two times a day (BID) | ORAL | 0 refills | Status: DC | PRN
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
## 2017-07-27 ENCOUNTER — Encounter: Payer: Self-pay | Admitting: Medical

## 2017-08-03 ENCOUNTER — Other Ambulatory Visit (HOSPITAL_COMMUNITY)
Admission: RE | Admit: 2017-08-03 | Discharge: 2017-08-03 | Disposition: A | Discharge status: Home / Self Care | Payer: Medicaid Other | Source: Ambulatory Visit | Attending: Family Medicine | Admitting: Family Medicine

## 2017-08-03 ENCOUNTER — Ambulatory Visit (INDEPENDENT_AMBULATORY_CARE_PROVIDER_SITE_OTHER): Payer: Medicaid Other | Admitting: Family Medicine

## 2017-08-03 ENCOUNTER — Encounter: Payer: Self-pay | Admitting: Family Medicine

## 2017-08-03 VITALS — BP 112/60 | HR 80 | Temp 98.1°F | Ht 64.0 in | Wt 152.0 lb

## 2017-08-03 DIAGNOSIS — Z124 Encounter for screening for malignant neoplasm of cervix: Secondary | ICD-10-CM | POA: Insufficient documentation

## 2017-08-03 DIAGNOSIS — N61 Mastitis without abscess: Secondary | ICD-10-CM

## 2017-08-03 DIAGNOSIS — N926 Irregular menstruation, unspecified: Secondary | ICD-10-CM

## 2017-08-03 DIAGNOSIS — R87810 Cervical high risk human papillomavirus (HPV) DNA test positive: Secondary | ICD-10-CM | POA: Insufficient documentation

## 2017-08-03 LAB — POCT URINE PREGNANCY: PREG TEST UR: NEGATIVE

## 2017-08-03 MED ORDER — DOXYCYCLINE HYCLATE 100 MG PO TABS: 100.0000 mg | ORAL_TABLET | Freq: Two times a day (BID) | ORAL | 0 refills | Status: DC
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
## 2017-08-03 NOTE — Patient Instructions (Signed)
Mastitis  Mastitis is inflammation of the breast tissue. It occurs most often in women who are breastfeeding, but it can also affect other women, and even sometimes men.  What are the causes?  Mastitis is usually caused by a bacterial infection. Bacteria enter the breast tissue through cuts or openings in the skin. Typically, this occurs with breastfeeding because of cracked or irritated skin. Sometimes, it can occur even when there is no opening in the skin. It can be associated with plugged milk (lactiferous) ducts. Nipple piercing can also lead to mastitis. Also, some forms of breast cancer can cause mastitis.  What are the signs or symptoms?  · Swelling, redness, tenderness, and pain in an area of the breast.  · Swelling of the glands under the arm on the same side.  · Fever.  If an infection is allowed to progress, a collection of pus (abscess) may develop.  How is this diagnosed?  Your health care provider can usually diagnose mastitis based on your symptoms and a physical exam. Tests may be done to help confirm the diagnosis. These may include:  · Removal of pus from the breast by applying pressure to the area. This pus can be examined in the lab to determine which bacteria are present. If an abscess has developed, the fluid in the abscess can be removed with a needle. This can also be used to confirm the diagnosis and determine the bacteria present. In most cases, pus will not be present.  · Blood tests to determine if your body is fighting a bacterial infection.  · Mammogram or ultrasound tests to rule out other problems or diseases.    How is this treated?  Antibiotic medicine is used to treat a bacterial infection. Your health care provider will determine which bacteria are most likely causing the infection and will select an appropriate antibiotic. This is sometimes changed based on the results of tests performed to identify the bacteria, or if there is no response to the antibiotic selected. Antibiotics  are usually given by mouth. You may also be given medicine for pain.  Mastitis that occurs with breastfeeding will sometimes go away on its own, so your health care provider may choose to wait 24 hours after first seeing you to decide whether a prescription medicine is needed.  Follow these instructions at home:  · Only take over-the-counter or prescription medicines for pain, fever, or discomfort as directed by your health care provider.  · If your health care provider prescribed an antibiotic, take the medicine as directed. Make sure you finish it even if you start to feel better.  · Do not wear a tight or underwire bra. Wear a soft, supportive bra.  · Increase your fluid intake, especially if you have a fever.  · Women who are breastfeeding should follow these instructions:  ? Continue to empty the breast. Your health care provider can tell you whether this milk is safe for your infant or needs to be thrown out. You may be told to stop nursing until your health care provider thinks it is safe for your baby. Use a breast pump if you are advised to stop nursing.  ? Keep your nipples clean and dry.  ? Empty the first breast completely before going to the other breast. If your baby is not emptying your breasts completely for some reason, use a breast pump to empty your breasts.  ? If you go back to work, pump your breasts while at work to stay   in time with your nursing schedule.  ? Avoid allowing your breasts to become overly filled with milk (engorged).  Contact a health care provider if:  · You have pus-like discharge from the breast.  · Your symptoms do not improve with the treatment prescribed by your health care provider within 2 days.  Get help right away if:  · Your pain and swelling are getting worse.  · You have pain that is not controlled with medicine.  · You have a red line extending from the breast toward your armpit.  · You have a fever or persistent symptoms for more than 2-3 days.  · You have a fever  and your symptoms suddenly get worse.  This information is not intended to replace advice given to you by your health care provider. Make sure you discuss any questions you have with your health care provider.  Document Released: 11/22/2005 Document Revised: 04/29/2016 Document Reviewed: 06/22/2013  Elsevier Interactive Patient Education © 2017 [employer] Inc[employer]

## 2017-08-05 LAB — CYTOLOGY - PAP
Diagnosis: NEGATIVE
HPV: DETECTED — AB

## 2017-08-11 ENCOUNTER — Encounter: Payer: Self-pay | Admitting: Family Medicine

## 2017-08-11 NOTE — Telephone Encounter (Signed)
Talked to patient and explained to her the results of her Pap smear. Patient has a better understanding and know that she will not need further screening for the next three years.  Autumn NeighboursAbdoulaye Doneta Bayman, MD [facility], PGY-2

## 2017-12-30 ENCOUNTER — Ambulatory Visit: Payer: Medicaid Other | Admitting: Internal Medicine

## 2018-05-07 NOTE — Progress Notes (Deleted)
   [facility] Phone: [phone]   Date of Visit: 05/08/2018   HPI:  ***  ROS: See HPI.  PMFSH:  PMH: Hearing Loss Substance Abuse   PHYSICAL EXAM: There were no vitals taken for this visit. Gen: *** HEENT: *** Heart: *** Lungs: *** Neuro: *** Ext: ***  ASSESSMENT/PLAN:  Health maintenance:  -***  No problem-specific Assessment & Plan notes found for this encounter.  FOLLOW UP: Follow up in *** for ***  Palma HolterKanishka G Gunadasa, MD PGY 2 [facility]

## 2018-05-08 ENCOUNTER — Ambulatory Visit: Payer: Medicaid Other | Admitting: Internal Medicine

## 2018-09-10 ENCOUNTER — Encounter (HOSPITAL_COMMUNITY): Payer: Self-pay

## 2018-09-10 ENCOUNTER — Ambulatory Visit (HOSPITAL_COMMUNITY)
Admission: EM | Admit: 2018-09-10 | Discharge: 2018-09-10 | Disposition: A | Discharge status: Home / Self Care | Payer: Medicaid Other | Attending: Family Medicine | Admitting: Family Medicine

## 2018-09-10 DIAGNOSIS — L03113 Cellulitis of right upper limb: Secondary | ICD-10-CM

## 2018-09-10 DIAGNOSIS — N6459 Other signs and symptoms in breast: Secondary | ICD-10-CM

## 2018-09-10 DIAGNOSIS — F209 Schizophrenia, unspecified: Secondary | ICD-10-CM | POA: Diagnosis not present

## 2018-09-10 DIAGNOSIS — Z8249 Family history of ischemic heart disease and other diseases of the circulatory system: Secondary | ICD-10-CM | POA: Diagnosis not present

## 2018-09-10 DIAGNOSIS — L02411 Cutaneous abscess of right axilla: Secondary | ICD-10-CM | POA: Diagnosis not present

## 2018-09-10 DIAGNOSIS — L0291 Cutaneous abscess, unspecified: Secondary | ICD-10-CM

## 2018-09-10 DIAGNOSIS — F319 Bipolar disorder, unspecified: Secondary | ICD-10-CM | POA: Diagnosis not present

## 2018-09-10 DIAGNOSIS — R21 Rash and other nonspecific skin eruption: Secondary | ICD-10-CM | POA: Diagnosis not present

## 2018-09-10 DIAGNOSIS — F17200 Nicotine dependence, unspecified, uncomplicated: Secondary | ICD-10-CM | POA: Diagnosis not present

## 2018-09-10 MED ORDER — CEPHALEXIN 500 MG PO CAPS: 500.0000 mg | ORAL_CAPSULE | Freq: Three times a day (TID) | ORAL | 0 refills | Status: DC

## 2018-09-10 MED ORDER — HYDROCODONE-ACETAMINOPHEN 5-325 MG PO TABS
ORAL_TABLET | ORAL | Status: AC
  Filled 2018-09-10: qty 1

## 2018-09-10 MED ORDER — CEFTRIAXONE SODIUM 250 MG IJ SOLR
250.0000 mg | Freq: Once | INTRAMUSCULAR | Status: AC
  Administered 2018-09-10: 250 mg via INTRAMUSCULAR

## 2018-09-10 MED ORDER — HYDROCODONE-ACETAMINOPHEN 5-325 MG PO TABS
1.0000 | ORAL_TABLET | ORAL | Status: AC
  Administered 2018-09-10: 1 via ORAL

## 2018-09-10 NOTE — ED Triage Notes (Signed)
Pt presents with swelling and irritation on right arm and breast that is very painful.
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
## 2018-09-11 ENCOUNTER — Telehealth: Payer: Self-pay | Admitting: Family Medicine

## 2018-09-11 NOTE — Telephone Encounter (Signed)
Seen by me at Urgent Care yesterday and wishes to establish as a new pt. Schedule new patient appointment for anytime patient wishes.

## 2018-09-12 ENCOUNTER — Ambulatory Visit: Payer: Medicaid Other | Admitting: Family Medicine

## 2018-09-13 ENCOUNTER — Ambulatory Visit: Payer: Medicaid Other | Admitting: Family Medicine

## 2018-09-17 LAB — AEROBIC CULTURE W GRAM STAIN (SUPERFICIAL SPECIMEN): Special Requests: NORMAL

## 2018-09-17 LAB — AEROBIC CULTURE  (SUPERFICIAL SPECIMEN)

## 2018-11-05 ENCOUNTER — Emergency Department (HOSPITAL_COMMUNITY)
Admission: EM | Admit: 2018-11-05 | Discharge: 2018-11-05 | Disposition: A | Discharge status: Home / Self Care | Payer: Medicaid Other | Attending: Emergency Medicine | Admitting: Emergency Medicine

## 2018-11-05 ENCOUNTER — Encounter (HOSPITAL_COMMUNITY): Payer: Self-pay | Admitting: *Deleted

## 2018-11-05 DIAGNOSIS — L0291 Cutaneous abscess, unspecified: Secondary | ICD-10-CM

## 2018-11-05 DIAGNOSIS — L02411 Cutaneous abscess of right axilla: Secondary | ICD-10-CM | POA: Diagnosis not present

## 2018-11-05 MED ORDER — LIDOCAINE HCL (PF) 1 % IJ SOLN
5.0000 mL | Freq: Once | INTRAMUSCULAR | Status: AC
  Administered 2018-11-05: 5 mL via INTRADERMAL
  Filled 2018-11-05: qty 5

## 2018-11-05 MED ORDER — CLINDAMYCIN HCL 150 MG PO CAPS: 300.0000 mg | ORAL_CAPSULE | Freq: Three times a day (TID) | ORAL | 0 refills | Status: DC
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
## 2018-11-05 NOTE — ED Triage Notes (Signed)
To ED for eval o abscess to right axilla. Noted a few days ago. Hx of same with treatment at [facility]

## 2018-11-05 NOTE — Discharge Instructions (Addendum)
Take the prescribed medication as directed.  Warm compresses at home a few times a day. Follow-up with the surgery clinic-- can call for appt. Return to the ED for new or worsening symptoms.

## 2019-02-22 ENCOUNTER — Telehealth: Payer: Self-pay

## 2019-02-22 NOTE — Telephone Encounter (Signed)
Pt called nurse line stating she needs a letter stating she does have hearing problems. Pt stated she received a letter from [facility] stating she needs MD documentation stating she is deaf in one ear, so she can continue receiving her "checks." Once letter is generated we need mail to her home address. Doubled checked address with patient. Please advise.

## 2019-02-26 NOTE — Telephone Encounter (Signed)
Spoke with patient and let her know that we didn't have any proper documentation of this problem, besides it being mentioned on her problem list.  PCP advised that patient come in to be seen for hearing evaluation and then referral to audiology.  She agreed with plan and made an appointment for tomorrow with Dr. Christell Faith since pcp is not available.  Will forward to Dr. Obie Dredge to let her know.

## 2019-02-27 ENCOUNTER — Encounter: Payer: Self-pay | Admitting: Family Medicine

## 2019-02-27 ENCOUNTER — Other Ambulatory Visit: Payer: Self-pay

## 2019-02-27 ENCOUNTER — Ambulatory Visit (INDEPENDENT_AMBULATORY_CARE_PROVIDER_SITE_OTHER): Payer: Medicaid Other | Admitting: Family Medicine

## 2019-02-27 VITALS — BP 108/64 | HR 96 | Temp 98.9°F | Ht 64.0 in | Wt 158.6 lb

## 2019-02-27 DIAGNOSIS — R1084 Generalized abdominal pain: Secondary | ICD-10-CM

## 2019-02-27 DIAGNOSIS — Z01110 Encounter for hearing examination following failed hearing screening: Secondary | ICD-10-CM | POA: Diagnosis not present

## 2019-02-27 DIAGNOSIS — E162 Hypoglycemia, unspecified: Secondary | ICD-10-CM | POA: Insufficient documentation

## 2019-02-27 DIAGNOSIS — H9191 Unspecified hearing loss, right ear: Secondary | ICD-10-CM

## 2019-02-27 DIAGNOSIS — R61 Generalized hyperhidrosis: Secondary | ICD-10-CM | POA: Diagnosis not present

## 2019-02-27 HISTORY — DX: Unspecified hearing loss, right ear: H91.91

## 2019-02-27 LAB — GLUCOSE, POCT (MANUAL RESULT ENTRY): POC GLUCOSE: 105 mg/dL — AB (ref 70–99)

## 2019-02-27 NOTE — Assessment & Plan Note (Signed)
Concern for postprandial hypoglycemia given patient's report of this episode and reported blood sugar in 60s.  Reassured as this has only occurred once.  Given patient's night sweats as well, will obtain labs today.  Return precautions given to patient.  She was advised to check her pulse and check her sugar if this happens again.  She is also has advised to make an appointment for sooner than 2 months if this occurs again.  She does have a family history of diabetes, but does not have any weight changes and is otherwise been feeling well aside from this acute illness.  Blood sugar today 105, which is also reassuring. -Obtain CBC with differential, hepatitis C, HIV, TSH -Return precautions given, patient voiced understanding -Patient to check her pulse and blood sugar if this occurs again -Do not think that glucometer is needed at this time, as she has access to home health nurse if needed and was an isolated incident -Return in 2 months or sooner as needed to see PCP for follow-up  Discussed case with Dr. Deirdre Priest and Dr. Manson Passey

## 2019-02-27 NOTE — Patient Instructions (Addendum)
Thank you for coming to see me today. It was a pleasure. Today we talked about:   Your hearing: You failed your hearing test on the right.  I have placed a referral to audiology for your hearing.  If you do not hear from them in the next 2 weeks, please give [place] a call.  Your abdominal pain: It is likely just some inflammation in your bowel that will improve within the week.  If you are not better, or start to have fevers and can't eat or drink, please call us.  Your recent dizziness and low sugar:  Your sugar looked good today.  We will get some labs.  If they are abnormal, I will call you.  If this happens again, check your heart rate and check your blood sugar.  Also give [place] a call to make another appointment.  Please come back in 2 months for follow up with Dr. Sydnee Cabal.  If you have any questions or concerns, please do not hesitate to call the office at [phone].  Best,   Luis Abed, DO   How to Take a Pulse Your pulse is the increase in pressure inside the blood vessels that carry blood from your heart to the rest of your body (arteries). Every time your heart beats, you can feel your pulse in an artery near the surface of your skin. You can easily feel your pulse in the artery in your wrist (radial artery) and in the artery in your neck (carotid artery). Taking your pulse can tell you how fast your heart is beating and whether it has a normal rhythm. You can also tell whether your heart is beating strongly or weakly. What you need to know about pulse rates Your pulse is the same as your heart rate. Both are measured in beats per minute (bpm). A normal resting heart rate varies depending on a person's age.  Infants under 1 year of age: Normal heart rate of 100-160 bpm.  Children 40-18 years of age: Normal heart rate of 90-150 bpm.  Children 48-75 years of age: Normal heart rate of 80-140 bpm.  Children 60-54 years of age: Normal heart rate of 70-120 bpm.  Everyone over 69  years of age: Normal heart rate of 60-100 bpm. There can be a lot of variation in your pulse. It can be different depending on the time of day or the amount of exercise that you get. It changes with your fitness level. Many things can change the speed and regularity of your pulse. These include:  Exercise.  Fever.  Stress.  Heart problems.  Poor circulation.  Medicines. How to take your pulse To take your pulse, all you need is a digital stopwatch or a clock or watch that has a second hand. The best time to measure your resting pulse is in the morning before you start moving around. Take it as soon as you wake up or after resting for about 10 minutes. There are no firm rules about how often to check your pulse. In general, it is a good idea to check your pulse at least once a month. Measuring your pulse is a good way to check your heart health. Checking your pulse before and after exercise can tell you if you are getting the right amount of exercise. This is called finding your target heart rate. Your target heart rate depends on your age, fitness, and health. Ask your health care provider what would be a safe target heart rate for  you during exercise. Radial Pulse To check the pulse in your radial artery: 1. Turn one hand palm-up and relax your arm. 2. Place the first two fingers of your other hand gently over your wrist, just below the base of your thumb. 3. Place your fingertips just inside the bone that runs along the outside of your arm. 4. Slowly increase pressure until you feel a pulsing beneath your fingers. You may need to move your fingers slightly. 5. Do not press too hard. Too much pressure may cut off blood supply. 6. Count how many pulse beats you feel in 1 minute. Or, count how many pulse beats you feel in 30 seconds and double that number. 7. Pay attention to the rhythm of the pulse. It should be steady and even.  Carotid Pulse To check the pulse in your carotid  artery: 1. Place two fingers just to one side of your Adam's apple so that you feel a pulsing beneath your fingers. 2. Do not press too hard. Too much pressure may cut off blood supply and can make you dizzy. 3. Count how many pulse beats you feel in 1 minute. Or, count how many pulse beats you feel in 30 seconds and double that number. 4. Pay attention to the rhythm of the pulse. It should be steady and even.  Contact a health care provider if:  Your pulse is too slow or too fast.  Your pulse is weak or hard to find.  You have skipped beats or extra beats.  Your pulse has an irregular rhythm.  You have an abnormal pulse along with dizziness, fatigue, or shortness of breath. This information is not intended to replace advice given to you by your health care provider. Make sure you discuss any questions you have with your health care provider. Document Released: 05/29/2003 Document Revised: 06/11/2016 Document Reviewed: 04/27/2016 Elsevier Interactive Patient Education  2019 [employer].

## 2019-02-27 NOTE — Assessment & Plan Note (Signed)
Failed hearing test on right.  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  She states that she needs this documented for so security. -Refer to audiology for hearing evaluation given failed hearing screen on right -Would recommend that audiology evaluation performed prior to sending letter to Social Security for appropriate documentation

## 2019-02-27 NOTE — Progress Notes (Signed)
Subjective: Chief Complaint  Patient presents with   Hearing Problem   Abdominal Pain     HPI: Autumn Kaiser is a 28 y.o. presenting to clinic today to discuss the following:  1 Hearing Problem Patient states that she has been known to have hearing loss in her right ear since about age 48.  She was told that this is congenital.  She is presenting today because she needs documentation of this in her chart in order to have a letter sent to Social Security regarding this hearing loss.  She states that she believes she has been seen by an audiologist in the past, but has not recently.  She denies any changes in her hearing recently.  States that she does not have difficulty in hearing in her left ear.  States that she did have hearing aid in her right ear when she was a child at school.  2 Abdominal Pain Patient reports 2 days of right-sided abdominal pain, which occasionally moves to the left.  She describes this as an occasionally sharp pain.  She states that that she feels constipated and struggles to have bowel movements, but notes that she has been having diarrhea.  She states that she has been having small bowel movements about 3-4 times a day, but states she continues to feel as though she has constipation.  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  She states that her abdominal pain will flare up during the bowel movement, but will improve shortly afterwards.  She states that at present the pain is about 3 out of 10, but is 6-7 out of 10 at worst.  The pain goes away when she is sleeping and when she is up and moving around.  She states that otherwise it happens randomly and has no alleviating or remitting factors.  She has not tried any medications.  She does not have a history of abdominal surgeries.  She states that she is able to eat and drink without difficulty and that this does not worsen the pain.  She denies any  recent changes in her diet and states that she has been eating as she usually does.  She denies any fevers.  Did have one episode of NBNB vomiting yesterday.  Denies hematochezia, melena.  No nausea or belching.  Denies shortness of breath, cough.  Denies any recent sick contacts.  States that she has only been home into the store in the last week.  No recent travel.  States that the pain occasionally worsens with fatty foods, but not always.  LMP 3/1, patient is not currently menstruating.  Denies urinary symptoms or vaginal symptoms.  Denies large changes in her weight.  States that she does have night sweats "frequently" but not daily.  She states that these occur at night, and are not only associated with her menstruation.  3 Reported Hypoglycemia  Patient states that about a week ago she was sleeping on her couch, when she started to sweat and fell off the couch.  She states that she awoke and was shaking some.  She states that she was able to crawl to the kitchen where she drank a glass of orange juice.  She then crawled back to the couch and slept most of the evening.  She states that this occurred at about 10 PM and was about 2 hours after she had eaten a dinner which included cube steak,  gravy, potatoes.  She states that she woke around 9 AM, when her grandmother's home health nurse arrived.  She states that the home health nurse said that she did not look well and therefore checked her blood sugar.  She stated that her sugar was in the 60s when it was checked and she was advised by the home health nurse to talk with her physician about this.  She has not had any episodes like this before or since then.  She does have a family history of diabetes in her mother and maternal grandmother.  Also has a history of hypothyroidism in her mother.     ROS noted in HPI.   Past Medical, Surgical, Social, and Family History Reviewed & Updated per EMR.   Pertinent Historical Findings include:   Social History    Tobacco Use  Smoking Status Current Some Day Smoker   Packs/day: 0.15  Smokeless Tobacco Never Used      Objective: BP 108/64    Pulse 96    Temp 98.9 F (37.2 C) (Oral)    Ht  (1.626 m)    Wt 158 lb 9.6 oz (71.9 kg)    LMP 02/04/2019 (Approximate)    SpO2 98%    BMI 27.22 kg/m  Vitals and nursing notes reviewed  Physical Exam: General: 27 y.o. female in NAD, well-appearing Neck: supple, no LAD, no thryomegaly Cardio: RRR no m/r/g Lungs: CTAB, no wheezing, no rhonchi, no crackles, no increased WOB Abdomen: Soft, non-tender to palpation, positive bowel sounds, no rebound, negative McBurney's, negative Murphy's Skin: warm and dry  Hearing Test: Passed left, failed right  Results for orders placed or performed in visit on 02/27/19 (from the past 72 hour(s))  Glucose (CBG)     Status: Abnormal   Collection Time: 02/27/19 11:59 AM  Result Value Ref Range   POC Glucose 105 (A) 70 - 99 mg/dl    Assessment/Plan:  Hearing loss of right ear Failed hearing test on right.  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  She states that she needs this documented for so security. -Refer to audiology for hearing evaluation given failed hearing screen on right -Would recommend that audiology evaluation performed prior to sending letter to Social Security for appropriate documentation  Generalized abdominal pain Acute in nature, occurring for the last 2 days.  Associated with diarrhea, vomiting.  No red flag symptoms aside from night sweats, but those have been occurring for over a year, therefore not likely related to this.  Reassured as patient is able to hydrate and eat normally.  Also reassured by benign abdominal exam exam.  Suspect this is likely viral gastroenteritis.  -Return precautions discussed with patient -Continue fluid hydration  Hypoglycemia without diagnosis of diabetes mellitus Concern for postprandial hypoglycemia given patient's  report of this episode and reported blood sugar in 60s.  Reassured as this has only occurred once.  Given patient's night sweats as well, will obtain labs today.  Return precautions given to patient.  She was advised to check her pulse and check her sugar if this happens again.  She is also has advised to make an appointment for sooner than 2 months if this occurs again.  She does have a family history of diabetes, but does not have any weight changes and is otherwise been feeling well aside from this acute illness.  Blood sugar today 105, which is also reassuring. -Obtain CBC with differential, hepatitis C, HIV, TSH -Return  precautions given, patient voiced understanding -Patient to check her pulse and blood sugar if this occurs again -Do not think that glucometer is needed at this time, as she has access to home health nurse if needed and was an isolated incident -Return in 2 months or sooner as needed to see PCP for follow-up  Discussed case with Dr. Deirdre Priest and Dr. Manson Passey     PATIENT EDUCATION PROVIDED: See AVS    Diagnosis and plan along with any newly prescribed medication(s) were discussed in detail with this patient today. The patient verbalized understanding and agreed with the plan. Patient advised if symptoms worsen return to clinic or ER.    Orders Placed This Encounter  Procedures   CBC with Differential   Hepatitis C Antibody   HIV antibody (with reflex)   TSH   Ambulatory referral to Audiology    Referral Priority:   Routine    Referral Type:   Audiology Exam    Referral Reason:   Specialty Services Required    Number of Visits Requested:   1   Glucose (CBG)    No orders of the defined types were placed in this encounter.    Luis Abed, DO 02/27/2019, 12:36 PM PGY-1 [facility]

## 2019-02-27 NOTE — Assessment & Plan Note (Addendum)
Acute in nature, occurring for the last 2 days.  Associated with diarrhea, vomiting.  No red flag symptoms aside from night sweats, but those have been occurring for over a year, therefore not likely related to this.  Reassured as patient is able to hydrate and eat normally.  Also reassured by benign abdominal exam exam.  Suspect this is likely viral gastroenteritis.  -Return precautions discussed with patient -Continue fluid hydration

## 2019-02-28 ENCOUNTER — Telehealth: Payer: Self-pay | Admitting: Family Medicine

## 2019-02-28 ENCOUNTER — Other Ambulatory Visit: Payer: Self-pay | Admitting: Family Medicine

## 2019-02-28 DIAGNOSIS — D72810 Lymphocytopenia: Secondary | ICD-10-CM

## 2019-02-28 LAB — CBC WITH DIFFERENTIAL/PLATELET
BASOS: 0 %
Basophils Absolute: 0 10*3/uL (ref 0.0–0.2)
EOS (ABSOLUTE): 0 10*3/uL (ref 0.0–0.4)
Eos: 2 %
Hematocrit: 43.1 % (ref 34.0–46.6)
Hemoglobin: 13.9 g/dL (ref 11.1–15.9)
IMMATURE GRANULOCYTES: 0 %
Immature Grans (Abs): 0 10*3/uL (ref 0.0–0.1)
LYMPHS ABS: 0.6 10*3/uL — AB (ref 0.7–3.1)
Lymphs: 26 %
MCH: 28 pg (ref 26.6–33.0)
MCHC: 32.3 g/dL (ref 31.5–35.7)
MCV: 87 fL (ref 79–97)
MONOS ABS: 0.2 10*3/uL (ref 0.1–0.9)
Monocytes: 9 %
NEUTROS PCT: 63 %
Neutrophils Absolute: 1.5 10*3/uL (ref 1.4–7.0)
PLATELETS: 180 10*3/uL (ref 150–450)
RBC: 4.97 x10E6/uL (ref 3.77–5.28)
RDW: 14.4 % (ref 11.7–15.4)
WBC: 2.4 10*3/uL — AB (ref 3.4–10.8)

## 2019-02-28 LAB — HEPATITIS C ANTIBODY: Hep C Virus Ab: <0.1 s/co ratio (ref 0.0–0.9)

## 2019-02-28 LAB — HIV ANTIBODY (ROUTINE TESTING W REFLEX): HIV Screen 4th Generation wRfx: NONREACTIVE

## 2019-02-28 LAB — TSH: TSH: 0.992 u[IU]/mL (ref 0.450–4.500)

## 2019-02-28 NOTE — Telephone Encounter (Signed)
Spoke with patient regarding lab results.  Informed her of low WBC.  Likely 2/2 viral infection at present, but would like to recheck in 2 weeks when patient is well.  Patient voiced understanding of this.  She also notes that she has not had abdominal pain since her appointment.

## 2019-02-28 NOTE — Progress Notes (Signed)
CBC with diff shows lymphocytopenia.  HIV non-reactive.  Likely 2/2 viral gastroenteritis.  Patient has had leukopenia in the past as well.  Plan to recheck in 2 weeks when patient is feeling well.  If remains low in the setting of no illness, would consider Hematology referral.  Patient called and informed of lab order.

## 2019-04-13 ENCOUNTER — Ambulatory Visit: Payer: Medicaid Other | Admitting: Family Medicine

## 2019-07-09 ENCOUNTER — Ambulatory Visit: Payer: Medicaid Other

## 2019-10-05 ENCOUNTER — Other Ambulatory Visit: Payer: Self-pay

## 2019-10-05 ENCOUNTER — Emergency Department (HOSPITAL_COMMUNITY)
Admission: EM | Admit: 2019-10-05 | Discharge: 2019-10-05 | Disposition: A | Discharge status: Home / Self Care | Payer: Medicaid Other | Attending: Emergency Medicine | Admitting: Emergency Medicine

## 2019-10-05 ENCOUNTER — Emergency Department (HOSPITAL_COMMUNITY): Payer: Medicaid Other

## 2019-10-05 ENCOUNTER — Encounter (HOSPITAL_COMMUNITY): Payer: Self-pay

## 2019-10-05 DIAGNOSIS — Y929 Unspecified place or not applicable: Secondary | ICD-10-CM | POA: Diagnosis not present

## 2019-10-05 DIAGNOSIS — Z79899 Other long term (current) drug therapy: Secondary | ICD-10-CM | POA: Insufficient documentation

## 2019-10-05 DIAGNOSIS — W2209XA Striking against other stationary object, initial encounter: Secondary | ICD-10-CM | POA: Insufficient documentation

## 2019-10-05 DIAGNOSIS — S6992XA Unspecified injury of left wrist, hand and finger(s), initial encounter: Secondary | ICD-10-CM | POA: Diagnosis present

## 2019-10-05 DIAGNOSIS — Y9389 Activity, other specified: Secondary | ICD-10-CM | POA: Diagnosis not present

## 2019-10-05 DIAGNOSIS — S66912A Strain of unspecified muscle, fascia and tendon at wrist and hand level, left hand, initial encounter: Secondary | ICD-10-CM | POA: Diagnosis not present

## 2019-10-05 DIAGNOSIS — F17200 Nicotine dependence, unspecified, uncomplicated: Secondary | ICD-10-CM | POA: Insufficient documentation

## 2019-10-05 DIAGNOSIS — Y999 Unspecified external cause status: Secondary | ICD-10-CM | POA: Diagnosis not present

## 2019-10-05 MED ORDER — OXYCODONE-ACETAMINOPHEN 5-325 MG PO TABS
1.0000 | ORAL_TABLET | Freq: Once | ORAL | Status: AC
  Administered 2019-10-05: 1 via ORAL
  Filled 2019-10-05: qty 1
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
## 2019-10-05 NOTE — ED Notes (Signed)
Ice pack placed on pts left hand.

## 2019-10-05 NOTE — Discharge Instructions (Addendum)
Recommend rest, ice, elevation for your affected hand.  Recommend anti-inflammatory such as ibuprofen or naproxen as well as Tylenol for pain control.  If you continue to have pain next week, recommend following up with the orthopedic office for recheck.  If you develop severe swelling, pain, numbness or weakness, recommend return to ER.

## 2019-10-05 NOTE — ED Notes (Signed)
Pt offered ice pack for hand. Pt refused. Pt ambulatory from triage to hall D
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
## 2019-11-25 ENCOUNTER — Ambulatory Visit (INDEPENDENT_AMBULATORY_CARE_PROVIDER_SITE_OTHER): Payer: Medicaid Other

## 2019-11-25 ENCOUNTER — Other Ambulatory Visit: Payer: Self-pay

## 2019-11-25 ENCOUNTER — Ambulatory Visit (HOSPITAL_COMMUNITY)
Admission: EM | Admit: 2019-11-25 | Discharge: 2019-11-25 | Disposition: A | Discharge status: Home / Self Care | Payer: Medicaid Other | Attending: Family Medicine | Admitting: Family Medicine

## 2019-11-25 ENCOUNTER — Encounter (HOSPITAL_COMMUNITY): Payer: Self-pay

## 2019-11-25 DIAGNOSIS — R05 Cough: Secondary | ICD-10-CM

## 2019-11-25 DIAGNOSIS — R059 Cough, unspecified: Secondary | ICD-10-CM

## 2019-11-25 DIAGNOSIS — Z3202 Encounter for pregnancy test, result negative: Secondary | ICD-10-CM | POA: Diagnosis not present

## 2019-11-25 DIAGNOSIS — R079 Chest pain, unspecified: Secondary | ICD-10-CM

## 2019-11-25 LAB — POC URINE PREG, ED: Preg Test, Ur: NEGATIVE

## 2019-11-25 LAB — POCT PREGNANCY, URINE: Preg Test, Ur: NEGATIVE

## 2019-11-25 MED ORDER — KETOROLAC TROMETHAMINE 60 MG/2ML IM SOLN
60.0000 mg | Freq: Once | INTRAMUSCULAR | Status: AC
  Administered 2019-11-25: 13:00:00 60 mg via INTRAMUSCULAR

## 2019-11-25 MED ORDER — ALBUTEROL SULFATE HFA 108 (90 BASE) MCG/ACT IN AERS
INHALATION_SPRAY | RESPIRATORY_TRACT | Status: AC
  Filled 2019-11-25: qty 6.7

## 2019-11-25 MED ORDER — ALBUTEROL SULFATE HFA 108 (90 BASE) MCG/ACT IN AERS
2.0000 | INHALATION_SPRAY | Freq: Once | RESPIRATORY_TRACT | Status: AC
  Administered 2019-11-25: 13:00:00 2 via RESPIRATORY_TRACT

## 2019-11-25 MED ORDER — KETOROLAC TROMETHAMINE 60 MG/2ML IM SOLN
INTRAMUSCULAR | Status: AC
  Filled 2019-11-25: qty 2

## 2019-11-25 NOTE — ED Triage Notes (Signed)
Pt states that when she moves or cough her chest hurts this pain is radiating into her back. This has been off and on 3 days. Pt states the pain is less now since she has been taking Tylenol.

## 2019-11-25 NOTE — Discharge Instructions (Addendum)
I have prescribed Naproxen for you to take tomorrow if twice daily as needed if pain doesn't improve. I have also prescribed an albuterol inhaler to loosen tightness of chest. I encouraged you to stop smoking as this is likely worsening current symptoms.  Chest x-ray is normal today.  Your electrocardiogram although non-specific indicated an occasional abnormal heart beat for which I recommend follow-up with your Primary Care provider for additional work.  If symptoms worsen or do not improve, please go immediately to the Emergency Department for further work-up and evaluation.
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
## 2020-01-10 ENCOUNTER — Ambulatory Visit: Payer: Medicaid Other | Admitting: Family Medicine

## 2020-01-22 ENCOUNTER — Encounter: Payer: Self-pay | Admitting: Family Medicine

## 2020-01-22 ENCOUNTER — Other Ambulatory Visit (HOSPITAL_COMMUNITY)
Admission: RE | Admit: 2020-01-22 | Discharge: 2020-01-22 | Disposition: A | Discharge status: Home / Self Care | Payer: Medicaid Other | Source: Ambulatory Visit | Attending: Family Medicine | Admitting: Family Medicine

## 2020-01-22 ENCOUNTER — Other Ambulatory Visit: Payer: Self-pay

## 2020-01-22 ENCOUNTER — Ambulatory Visit (INDEPENDENT_AMBULATORY_CARE_PROVIDER_SITE_OTHER): Payer: Medicaid Other | Admitting: Family Medicine

## 2020-01-22 VITALS — BP 110/60 | HR 97 | Ht 64.0 in | Wt 146.1 lb

## 2020-01-22 DIAGNOSIS — Z124 Encounter for screening for malignant neoplasm of cervix: Secondary | ICD-10-CM

## 2020-01-22 DIAGNOSIS — F17200 Nicotine dependence, unspecified, uncomplicated: Secondary | ICD-10-CM | POA: Diagnosis not present

## 2020-01-22 DIAGNOSIS — E162 Hypoglycemia, unspecified: Secondary | ICD-10-CM

## 2020-01-22 DIAGNOSIS — Z0000 Encounter for general adult medical examination without abnormal findings: Secondary | ICD-10-CM

## 2020-01-22 DIAGNOSIS — Z113 Encounter for screening for infections with a predominantly sexual mode of transmission: Secondary | ICD-10-CM

## 2020-01-22 LAB — POCT GLYCOSYLATED HEMOGLOBIN (HGB A1C): HbA1c, POC (controlled diabetic range): 5.6 % (ref 0.0–7.0)

## 2020-01-22 MED ORDER — NICOTINE 21 MG/24HR TD PT24: 21.0000 mg | MEDICATED_PATCH | Freq: Every day | TRANSDERMAL | 0 refills | Status: DC

## 2020-01-22 MED ORDER — CHANTIX STARTING MONTH PAK 0.5 MG X 11 & 1 MG X 42 PO TABS: ORAL_TABLET | ORAL | 0 refills | Status: DC

## 2020-01-22 NOTE — Patient Instructions (Signed)
Steps to Quit Smoking Smoking tobacco is the leading cause of preventable death. It can affect almost every organ in the body. Smoking puts you and people around you at risk for many serious, long-lasting (chronic) diseases. Quitting smoking can be hard, but it is one of the best things that you can do for your health. It is never too late to quit. How do I get ready to quit? When you decide to quit smoking, make a plan to help you succeed. Before you quit:  Pick a date to quit. Set a date within the next 2 weeks to give you time to prepare.  Write down the reasons why you are quitting. Keep this list in places where you will see it often.  Tell your family, friends, and co-workers that you are quitting. Their support is important.  Talk with your doctor about the choices that may help you quit.  Find out if your health insurance will pay for these treatments.  Know the people, places, things, and activities that make you want to smoke (triggers). Avoid them. What first steps can I take to quit smoking?  Throw away all cigarettes at home, at work, and in your car.  Throw away the things that you use when you smoke, such as ashtrays and lighters.  Clean your car. Make sure to empty the ashtray.  Clean your home, including curtains and carpets. What can I do to help me quit smoking? Talk with your doctor about taking medicines and seeing a counselor at the same time. You are more likely to succeed when you do both.  If you are pregnant or breastfeeding, talk with your doctor about counseling or other ways to quit smoking. Do not take medicine to help you quit smoking unless your doctor tells you to do so. To quit smoking: Quit right away  Quit smoking totally, instead of slowly cutting back on how much you smoke over a period of time.  Go to counseling. You are more likely to quit if you go to counseling sessions regularly. Take medicine You may take medicines to help you quit. Some  medicines need a prescription, and some you can buy over-the-counter. Some medicines may contain a drug called nicotine to replace the nicotine in cigarettes. Medicines may:  Help you to stop having the desire to smoke (cravings).  Help to stop the problems that come when you stop smoking (withdrawal symptoms). Your doctor may ask you to use:  Nicotine patches, gum, or lozenges.  Nicotine inhalers or sprays.  Non-nicotine medicine that is taken by mouth. Find resources Find resources and other ways to help you quit smoking and remain smoke-free after you quit. These resources are most helpful when you use them often. They include:  Online chats with a counselor.  Phone quitlines.  Printed self-help materials.  Support groups or group counseling.  Text messaging programs.  Mobile phone apps. Use apps on your mobile phone or tablet that can help you stick to your quit plan. There are many free apps for mobile phones and tablets as well as websites. Examples include Quit Guide from the CDC and smokefree.gov  What things can I do to make it easier to quit?   Talk to your family and friends. Ask them to support and encourage you.  Call a phone quitline (1-800-QUIT-NOW), reach out to support groups, or work with a counselor.  Ask people who smoke to not smoke around you.  Avoid places that make you want to smoke,   such as: ? Bars. ? Parties. ? Smoke-break areas at work.  Spend time with people who do not smoke.  Lower the stress in your life. Stress can make you want to smoke. Try these things to help your stress: ? Getting regular exercise. ? Doing deep-breathing exercises. ? Doing yoga. ? Meditating. ? Doing a body scan. To do this, close your eyes, focus on one area of your body at a time from head to toe. Notice which parts of your body are tense. Try to relax the muscles in those areas. How will I feel when I quit smoking? Day 1 to 3 weeks Within the first 24 hours,  you may start to have some problems that come from quitting tobacco. These problems are very bad 2-3 days after you quit, but they do not often last for more than 2-3 weeks. You may get these symptoms:  Mood swings.  Feeling restless, nervous, angry, or annoyed.  Trouble concentrating.  Dizziness.  Strong desire for high-sugar foods and nicotine.  Weight gain.  Trouble pooping (constipation).  Feeling like you may vomit (nausea).  Coughing or a sore throat.  Changes in how the medicines that you take for other issues work in your body.  Depression.  Trouble sleeping (insomnia). Week 3 and afterward After the first 2-3 weeks of quitting, you may start to notice more positive results, such as:  Better sense of smell and taste.  Less coughing and sore throat.  Slower heart rate.  Lower blood pressure.  Clearer skin.  Better breathing.  Fewer sick days. Quitting smoking can be hard. Do not give up if you fail the first time. Some people need to try a few times before they succeed. Do your best to stick to your quit plan, and talk with your doctor if you have any questions or concerns. Summary  Smoking tobacco is the leading cause of preventable death. Quitting smoking can be hard, but it is one of the best things that you can do for your health.  When you decide to quit smoking, make a plan to help you succeed.  Quit smoking right away, not slowly over a period of time.  When you start quitting, seek help from your doctor, family, or friends. This information is not intended to replace advice given to you by your health care provider. Make sure you discuss any questions you have with your health care provider. Document Revised: 08/17/2019 Document Reviewed: 02/10/2019 Elsevier Patient Education  [place] 41-79 Years Old, Female Preventive care refers to visits with your health care provider and lifestyle choices that can promote health and  wellness. This includes:  A yearly physical exam. This may also be called an annual well check.  Regular dental visits and eye exams.  Immunizations.  Screening for certain conditions.  Healthy lifestyle choices, such as eating a healthy diet, getting regular exercise, not using drugs or products that contain nicotine and tobacco, and limiting alcohol use. What can I expect for my preventive care visit? Physical exam Your health care provider will check your:  Height and weight. This may be used to calculate body mass index (BMI), which tells if you are at a healthy weight.  Heart rate and blood pressure.  Skin for abnormal spots. Counseling Your health care provider may ask you questions about your:  Alcohol, tobacco, and drug use.  Emotional well-being.  Home and relationship well-being.  Sexual activity.  Eating habits.  Work and work Statistician.  Method of birth control.  Menstrual cycle.  Pregnancy history. What immunizations do I need?  Influenza (flu) vaccine  This is recommended every year. Tetanus, diphtheria, and pertussis (Tdap) vaccine  You may need a Td booster every 10 years. Varicella (chickenpox) vaccine  You may need this if you have not been vaccinated. Human papillomavirus (HPV) vaccine  If recommended by your health care provider, you may need three doses over 6 months. Measles, mumps, and rubella (MMR) vaccine  You may need at least one dose of MMR. You may also need a second dose. Meningococcal conjugate (MenACWY) vaccine  One dose is recommended if you are age 35-21 years and a first-year college student living in a residence hall, or if you have one of several medical conditions. You may also need additional booster doses. Pneumococcal conjugate (PCV13) vaccine  You may need this if you have certain conditions and were not previously vaccinated. Pneumococcal polysaccharide (PPSV23) vaccine  You may need one or two doses if you  smoke cigarettes or if you have certain conditions. Hepatitis A vaccine  You may need this if you have certain conditions or if you travel or work in places where you may be exposed to hepatitis A. Hepatitis B vaccine  You may need this if you have certain conditions or if you travel or work in places where you may be exposed to hepatitis B. Haemophilus influenzae type b (Hib) vaccine  You may need this if you have certain conditions. You may receive vaccines as individual doses or as more than one vaccine together in one shot (combination vaccines). Talk with your health care provider about the risks and benefits of combination vaccines. What tests do I need?  Blood tests  Lipid and cholesterol levels. These may be checked every 5 years starting at age 75.  Hepatitis C test.  Hepatitis B test. Screening  Diabetes screening. This is done by checking your blood sugar (glucose) after you have not eaten for a while (fasting).  Sexually transmitted disease (STD) testing.  BRCA-related cancer screening. This may be done if you have a family history of breast, ovarian, tubal, or peritoneal cancers.  Pelvic exam and Pap test. This may be done every 3 years starting at age 26. Starting at age 73, this may be done every 5 years if you have a Pap test in combination with an HPV test. Talk with your health care provider about your test results, treatment options, and if necessary, the need for more tests. Follow these instructions at home: Eating and drinking   Eat a diet that includes fresh fruits and vegetables, whole grains, lean protein, and low-fat dairy.  Take vitamin and mineral supplements as recommended by your health care provider.  Do not drink alcohol if: ? Your health care provider tells you not to drink. ? You are pregnant, may be pregnant, or are planning to become pregnant.  If you drink alcohol: ? Limit how much you have to 0-1 drink a day. ? Be aware of how much  alcohol is in your drink. In the [place] one drink equals one 12 oz bottle of beer (355 mL), one 5 oz glass of wine (148 mL), or one 1 oz glass of hard liquor (44 mL). Lifestyle  Take daily care of your teeth and gums.  Stay active. Exercise for at least 30 minutes on 5 or more days each week.  Do not use any products that contain nicotine or tobacco, such as cigarettes, e-cigarettes, and chewing  tobacco. If you need help quitting, ask your health care provider.  If you are sexually active, practice safe sex. Use a condom or other form of birth control (contraception) in order to prevent pregnancy and STIs (sexually transmitted infections). If you plan to become pregnant, see your health care provider for a preconception visit. What's next?  Visit your health care provider once a year for a well check visit.  Ask your health care provider how often you should have your eyes and teeth checked.  Stay up to date on all vaccines. This information is not intended to replace advice given to you by your health care provider. Make sure you discuss any questions you have with your health care provider. Document Revised: 08/03/2018 Document Reviewed: 08/03/2018 Elsevier Patient Education  2020 [employer].

## 2020-01-22 NOTE — Assessment & Plan Note (Addendum)
Asymptomatic.  Intermittent condom use.  No interest in alternative contraception alternatives at this time. - Recommend condoms for contraception and prevention of STIs with condoms. - Gonorrhea, chlamydia, trichomonas, HIV, RPR

## 2020-01-22 NOTE — Progress Notes (Signed)
a1c

## 2020-01-22 NOTE — Assessment & Plan Note (Addendum)
-   Trial of Chantix and patches.  - Patient follow-up in 1 month

## 2020-01-22 NOTE — Assessment & Plan Note (Signed)
Routine Pap with HPV testing
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
## 2020-01-23 LAB — HIV ANTIBODY (ROUTINE TESTING W REFLEX): HIV Screen 4th Generation wRfx: NONREACTIVE

## 2020-01-23 LAB — RPR: RPR Ser Ql: NONREACTIVE

## 2020-01-25 LAB — CYTOLOGY - PAP
Chlamydia: NEGATIVE
Comment: NEGATIVE
Comment: NEGATIVE
Comment: NEGATIVE
Comment: NORMAL
Diagnosis: NEGATIVE
Diagnosis: REACTIVE
High risk HPV: NEGATIVE
Neisseria Gonorrhea: NEGATIVE
Trichomonas: POSITIVE — AB

## 2020-01-26 ENCOUNTER — Other Ambulatory Visit: Payer: Self-pay | Admitting: Family Medicine

## 2020-01-26 DIAGNOSIS — A5901 Trichomonal vulvovaginitis: Secondary | ICD-10-CM

## 2020-01-26 MED ORDER — METRONIDAZOLE 500 MG PO TABS: 500.0000 mg | ORAL_TABLET | Freq: Two times a day (BID) | ORAL | 0 refills | Status: AC

## 2020-01-28 ENCOUNTER — Telehealth: Payer: Self-pay | Admitting: *Deleted

## 2020-01-28 NOTE — Telephone Encounter (Signed)
Pt is requesting the gel instead of pills as she has trouble swallowing pills.  To PCP. Jone Baseman, CMA

## 2020-01-29 NOTE — Telephone Encounter (Signed)
Patient voiced understanding.  Advised her to try breaking the pills in half and taking them this way to see if it helps.  Traye Bates,CMA

## 2020-02-17 ENCOUNTER — Emergency Department (HOSPITAL_COMMUNITY)
Admission: EM | Admit: 2020-02-17 | Discharge: 2020-02-17 | Discharge status: Against Medical Advice | Payer: Medicaid Other

## 2020-02-17 ENCOUNTER — Other Ambulatory Visit: Payer: Self-pay

## 2020-02-17 ENCOUNTER — Encounter (HOSPITAL_COMMUNITY): Payer: Self-pay | Admitting: Emergency Medicine

## 2020-02-17 NOTE — ED Triage Notes (Signed)
C/o abscess to L axilla x 2-3 days.  Denies fever and chills.

## 2020-02-19 ENCOUNTER — Ambulatory Visit: Payer: Medicaid Other | Admitting: Family Medicine

## 2020-03-20 ENCOUNTER — Ambulatory Visit: Payer: Medicaid Other

## 2020-04-04 ENCOUNTER — Ambulatory Visit: Payer: Medicaid Other | Admitting: Family Medicine

## 2020-04-04 ENCOUNTER — Emergency Department (HOSPITAL_COMMUNITY)
Admission: EM | Admit: 2020-04-04 | Discharge: 2020-04-04 | Disposition: A | Discharge status: Home / Self Care | Payer: Medicaid Other | Attending: Emergency Medicine | Admitting: Emergency Medicine

## 2020-04-04 ENCOUNTER — Encounter (HOSPITAL_COMMUNITY): Payer: Self-pay | Admitting: *Deleted

## 2020-04-04 ENCOUNTER — Other Ambulatory Visit: Payer: Self-pay

## 2020-04-04 ENCOUNTER — Emergency Department (HOSPITAL_COMMUNITY): Payer: Medicaid Other

## 2020-04-04 VITALS — BP 126/72 | HR 108 | Ht 64.0 in | Wt 149.2 lb

## 2020-04-04 DIAGNOSIS — M79641 Pain in right hand: Secondary | ICD-10-CM | POA: Insufficient documentation

## 2020-04-04 DIAGNOSIS — S6991XA Unspecified injury of right wrist, hand and finger(s), initial encounter: Secondary | ICD-10-CM | POA: Diagnosis present

## 2020-04-04 DIAGNOSIS — F339 Major depressive disorder, recurrent, unspecified: Secondary | ICD-10-CM

## 2020-04-04 DIAGNOSIS — Z5321 Procedure and treatment not carried out due to patient leaving prior to being seen by health care provider: Secondary | ICD-10-CM | POA: Diagnosis not present

## 2020-04-04 MED ORDER — ACETAMINOPHEN 500 MG PO TABS: 500.0000 mg | ORAL_TABLET | Freq: Three times a day (TID) | ORAL | 1 refills | Status: DC | PRN

## 2020-04-04 MED ORDER — IBUPROFEN 200 MG PO TABS: 200.0000 mg | ORAL_TABLET | Freq: Three times a day (TID) | ORAL | 1 refills | Status: DC | PRN

## 2020-04-04 NOTE — Progress Notes (Signed)
SUBJECTIVE:   CHIEF COMPLAINT / HPI:   Hand injury: Patient initially sustained right 5th MCP injury 2-3 months ago when she punched a wall.  States the hand was swollen and red and bruised, then healed slowly on its own over the last couple of months.  Last night she accidentally hit the hand very hard and had severe pain with swelling.  She went to the emergency department where an x-ray was performed, but the wait was too long so she ends up going home. Xray in the ED showed "no evidence of fracture or dislocation. There is no evidence of arthropathy or other focal bone abnormality. Mild dorsal soft tissue swelling is seen".  No weakness or loss of sensation is appreciated on the hand.  Depression: patient had a miscarriage 3 years ago and since then has been occasionally very unhappy, depressed.  She has got into a couple of verbal arguments with her significant other, she feels that she gets very upset very quickly.  She has not been interested in seeing a counselor before because she does not feel that this would be very helpful for her.  She denies any suicidal ideation, but states that sometimes other people make her very angry and makes her want to hurt them, although she states she would never do anything like this.  She has tried to talk to her family about her feelings with this issue before, but states they do not understand it and have been very helpful.  PERTINENT  PMH / PSH:  Noncontributory   OBJECTIVE:   BP 126/72   Pulse (!) 108   Ht 5\' 4"  (1.626 m)   Wt 149 lb 3.2 oz (67.7 kg)   LMP 04/02/2020 (Exact Date)   SpO2 97%   BMI 25.61 kg/m    Physical exam: General: Pleasant patient, very tearful at this time Respiratory: Comfortable work of breathing, speaking complete sentences MSK: RIGHT Hand: Inspection: No obvious deformity. Mild swelling, erythema appreciated to the 5th MCP and along 5th metacarpal bone Palpation: TTP elicited at 5th MCP and along shaft of 5th  metacarpal bone ROM: Full ROM of the wrists bilaterally, 5th digit has reduced Palmar flexion, full dorsiflexion appreciated  Strength: 5/5 strength in the forearm, wrist and interosseus muscles, however pain is elicited with strength testing with 5th finger Neurovascular: NV intact  DG Hand Complete Right  Result Date: 04/04/2020 CLINICAL DATA:  Injury EXAM: RIGHT HAND - COMPLETE 3+ VIEW COMPARISON:  None. FINDINGS: There is no evidence of fracture or dislocation. There is no evidence of arthropathy or other focal bone abnormality. Mild dorsal soft tissue swelling is seen. IMPRESSION: Negative. Electronically Signed   By: Prudencio Pair M.D.   On: 04/04/2020 01:22    ASSESSMENT/PLAN:   Recurrent depression ([place]) Patient expresses concerns with depression and mood instability after having a miscarriage 3 years prior.  Does not have much emotional support at home for this particular issue.  Patient denies any suicidal ideation, is safe to self. -Patient given many resources to seek help from a counselor/psychiatrist, was encouraged to contact these resources for additional help -Patient given specific resource regarding loss of an infant or child -Patient given information regarding the suicide prevention hotline -Follow-up 2 weeks for depression  Injury of right hand X-ray in the emergency department negative for fracture.  Swelling, tenderness, erythema appreciated on physical exam at today's office. -Take Tylenol 500mg  with Advil 200mg  every 8 hours for the next 5 days for pain. Then  take the two together every 8 hours as needed for pain after that.  -Wear your splint while up and moving during the day to protect finger, otherwise take it off while at rest to prevent Carpel Tunnel.  - try to find a "Boxer splint" to wear during the day from [employer], [employer], etc. - Apply a cool compress (does not have to be ice cold) at least 15 minutes 3 times daily.      Dollene Cleveland, DO [place]  [facility]

## 2020-04-04 NOTE — ED Triage Notes (Signed)
To ED for eval of right hand pain for past month since hitting a wall. Unable to have hand evaluated. Pt hit wall again today and pain increased. Min-mod swelling noted. Painful to touch.

## 2020-04-04 NOTE — Progress Notes (Signed)
I was consulted to provide brief coping strategies for pt.  S: Pt reported she experiences anger and frustration.  Pt reported she has been having sadness since the loss of her pregnancy a few years ago.  Pt reported she lacks support system. Pt and Dr. Shawnee Knapp talked through the importance of processing trauma and getting help.  Dr. Shawnee Knapp walked pt through some CBT coping strategies to calm herself in times of need.  O: Pt was oriented x4.  Pt was tearful upon presentation.  A: Pt has experienced a significant loss and suffers from depression, would benefit from therapy and possible medication.  P: Pt to f/u with mental health resources in community.  Pt was given suicide hotline number as well as support group information.   Royetta Asal, PhD., LMFT-A

## 2020-04-04 NOTE — Assessment & Plan Note (Signed)
Patient expresses concerns with depression and mood instability after having a miscarriage 3 years prior.  Does not have much emotional support at home for this particular issue.  Patient denies any suicidal ideation, is safe to self. -Patient given many resources to seek help from a counselor/psychiatrist, was encouraged to contact these resources for additional help -Patient given specific resource regarding loss of an infant or child -Patient given information regarding the suicide prevention hotline -Follow-up 2 weeks for depression

## 2020-04-04 NOTE — Assessment & Plan Note (Signed)
X-ray in the emergency department negative for fracture.  Swelling, tenderness, erythema appreciated on physical exam at today's office. -Take Tylenol 500mg  with Advil 200mg  every 8 hours for the next 5 days for pain. Then take the two together every 8 hours as needed for pain after that.  -Wear your splint while up and moving during the day to protect finger, otherwise take it off while at rest to prevent Carpel Tunnel.  - try to find a "Boxer splint" to wear during the day from [employer], [employer], etc. - Apply a cool compress (does not have to be ice cold) at least 15 minutes 3 times daily.

## 2020-04-04 NOTE — Patient Instructions (Addendum)
Thank you for coming in to see [place] today! Please see below to review our plan for today's visit:  1. Take Tylenol 500mg  with Advil 200mg  every 8 hours for the next 5 days for pain. Then take the two together every 8 hours as needed for pain after that.  2. Wear your finger splint while you are up and moving during the day to protect it, otherwise take it off while you are at rest to prevent Carpel Tunnel. The one we made for you today in clinic is a temporary fix - try to find a "Boxer splint" to wear during the day from [employer], [employer], etc. 3. Apply a cool compress (does not have to be ice cold) at least 15 minutes 3 times daily.  4. Please follow up in 2 weeks for depression and concerns with anger. 5. Please reach out to the below resources for support. There is a support group for moms who have lost infants, as well as many resources for contacting a psychiatrist or therapist!  Please call the clinic at [phone] if your symptoms worsen or you have any concerns. It was our pleasure to serve you!  Dr. Milus Banister [facility]  Check out: SHARE Pregnancy and Infant Loss Support www.nationalshare.org  Psychiatry and [place], [place] [phone]  Psychiatrists [facility]  [facility] [address]   [address], [place], [place] [address]   [place], [place] [address] [phone]     [phone]  Dr. Norma Fredrickson [facility]   [facility] [address]   [place] [address]   [place] [address] [phone]     [phone]  Sheralyn Boatman, [place] [address]   [place] [address]   [place] [address] [phone]     [phone]  Therapists [facility]  [facility] [address] [place], [place]     [phone]  [facility]  [facility] [address]      [address] [place] [address]     [place], [place]       [phone]  [facility]  [facility] [address]   [address] [place], [place] [address]   [place], [place] [address] [phone]     [phone]  [facility]   [facility] [address]    [place], [place] [address]    [place],  [address] [phone]      [phone]  When you're going through tough times, it's easy to feel lonely and overwhelmed. Remember that YOU ARE NOT ALONE and we at [facility] want to support you during this difficult time.  There are many ways to get help and support when you are ready.  You can call the following help lines: - National suicide hotline 660 382 5811) - [place] (1-800-SUICIDE)   You can schedule an appointment with a team member at [facility] - your primary care doc or one of our counselors.  We are all here to support you. A doctor is on call 24/7, so call us if you need to [phone]).  There are many treatments that can help people during difficult times, and we can talk with you about medications, counseling, diet, and other options. We want to offer you HOPE that it won't always feel this bad.   If you are seriously thinking about hurting yourself or have a plan, please call 911 or go to any emergency room right away for immediate help.

## 2020-04-22 ENCOUNTER — Ambulatory Visit: Payer: Medicaid Other | Admitting: Family Medicine

## 2020-05-09 ENCOUNTER — Ambulatory Visit (HOSPITAL_COMMUNITY)
Admission: EM | Admit: 2020-05-09 | Discharge: 2020-05-09 | Disposition: A | Discharge status: Home / Self Care | Payer: Medicaid Other | Attending: Family Medicine | Admitting: Family Medicine

## 2020-05-09 ENCOUNTER — Encounter (HOSPITAL_COMMUNITY): Payer: Self-pay

## 2020-05-09 ENCOUNTER — Other Ambulatory Visit: Payer: Self-pay

## 2020-05-09 DIAGNOSIS — N611 Abscess of the breast and nipple: Secondary | ICD-10-CM | POA: Diagnosis not present

## 2020-05-09 MED ORDER — IBUPROFEN 800 MG PO TABS: 800.0000 mg | ORAL_TABLET | Freq: Three times a day (TID) | ORAL | 0 refills | Status: DC

## 2020-05-09 MED ORDER — AMOXICILLIN-POT CLAVULANATE 875-125 MG PO TABS: 1.0000 | ORAL_TABLET | Freq: Two times a day (BID) | ORAL | 0 refills | Status: DC
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
## 2020-05-09 NOTE — Discharge Instructions (Signed)
Take antibiotic 2 times a day Take ibuprofen 3 times a day with food Put warm compresses on the area Call your primary care doctor on Monday.  You need to be seen next week for follow-up

## 2020-05-09 NOTE — ED Triage Notes (Signed)
Pt c/o pain to right breast for two months. Right medial nipple/areola area swollen, red, hot to touch. Denies drainage from site. Pt also c/o n/v, chills for two months as well.  No tylenol or other OTC recently.

## 2020-06-13 ENCOUNTER — Ambulatory Visit (INDEPENDENT_AMBULATORY_CARE_PROVIDER_SITE_OTHER): Payer: Medicaid Other | Admitting: Family Medicine

## 2020-06-13 DIAGNOSIS — Z5329 Procedure and treatment not carried out because of patient's decision for other reasons: Secondary | ICD-10-CM

## 2020-06-13 NOTE — Progress Notes (Signed)
No show

## 2020-06-20 ENCOUNTER — Ambulatory Visit (INDEPENDENT_AMBULATORY_CARE_PROVIDER_SITE_OTHER): Payer: Medicaid Other | Admitting: Family Medicine

## 2020-06-20 DIAGNOSIS — Z5329 Procedure and treatment not carried out because of patient's decision for other reasons: Secondary | ICD-10-CM

## 2020-06-20 NOTE — Progress Notes (Signed)
No Show

## 2021-01-30 ENCOUNTER — Ambulatory Visit: Payer: Medicaid Other | Admitting: Family Medicine

## 2021-02-17 ENCOUNTER — Emergency Department (HOSPITAL_COMMUNITY): Payer: Medicaid Other

## 2021-02-17 ENCOUNTER — Encounter (HOSPITAL_COMMUNITY): Payer: Self-pay

## 2021-02-17 ENCOUNTER — Emergency Department (HOSPITAL_COMMUNITY)
Admission: EM | Admit: 2021-02-17 | Discharge: 2021-02-17 | Disposition: A | Discharge status: Home / Self Care | Payer: Medicaid Other | Attending: Emergency Medicine | Admitting: Emergency Medicine

## 2021-02-17 ENCOUNTER — Other Ambulatory Visit: Payer: Self-pay

## 2021-02-17 DIAGNOSIS — S199XXA Unspecified injury of neck, initial encounter: Secondary | ICD-10-CM | POA: Diagnosis not present

## 2021-02-17 DIAGNOSIS — Z87891 Personal history of nicotine dependence: Secondary | ICD-10-CM | POA: Insufficient documentation

## 2021-02-17 DIAGNOSIS — R55 Syncope and collapse: Secondary | ICD-10-CM | POA: Insufficient documentation

## 2021-02-17 DIAGNOSIS — F10929 Alcohol use, unspecified with intoxication, unspecified: Secondary | ICD-10-CM | POA: Insufficient documentation

## 2021-02-17 DIAGNOSIS — S161XXA Strain of muscle, fascia and tendon at neck level, initial encounter: Secondary | ICD-10-CM | POA: Diagnosis not present

## 2021-02-17 DIAGNOSIS — F10920 Alcohol use, unspecified with intoxication, uncomplicated: Secondary | ICD-10-CM

## 2021-02-17 DIAGNOSIS — W1839XA Other fall on same level, initial encounter: Secondary | ICD-10-CM | POA: Insufficient documentation

## 2021-02-17 DIAGNOSIS — Y908 Blood alcohol level of 240 mg/100 ml or more: Secondary | ICD-10-CM | POA: Insufficient documentation

## 2021-02-17 DIAGNOSIS — Y9389 Activity, other specified: Secondary | ICD-10-CM | POA: Insufficient documentation

## 2021-02-17 DIAGNOSIS — Y9289 Other specified places as the place of occurrence of the external cause: Secondary | ICD-10-CM | POA: Diagnosis not present

## 2021-02-17 DIAGNOSIS — Y999 Unspecified external cause status: Secondary | ICD-10-CM | POA: Insufficient documentation

## 2021-02-17 DIAGNOSIS — R4182 Altered mental status, unspecified: Secondary | ICD-10-CM | POA: Diagnosis present

## 2021-02-17 LAB — URINALYSIS, ROUTINE W REFLEX MICROSCOPIC
Bacteria, UA: NONE SEEN
Bilirubin Urine: NEGATIVE
Glucose, UA: NEGATIVE mg/dL
Ketones, ur: 20 mg/dL — AB
Leukocytes,Ua: NEGATIVE
Nitrite: NEGATIVE
Protein, ur: 30 mg/dL — AB
Specific Gravity, Urine: 1.006 (ref 1.005–1.030)
pH: 5 (ref 5.0–8.0)

## 2021-02-17 LAB — CBC
HCT: 54.1 % — ABNORMAL HIGH (ref 36.0–46.0)
Hemoglobin: 17 g/dL — ABNORMAL HIGH (ref 12.0–15.0)
MCH: 29 pg (ref 26.0–34.0)
MCHC: 31.4 g/dL (ref 30.0–36.0)
MCV: 92.3 fL (ref 80.0–100.0)
Platelets: 241 10*3/uL (ref 150–400)
RBC: 5.86 MIL/uL — ABNORMAL HIGH (ref 3.87–5.11)
RDW: 14.7 % (ref 11.5–15.5)
WBC: 4 10*3/uL (ref 4.0–10.5)
nRBC: 0 % (ref 0.0–0.2)

## 2021-02-17 LAB — RAPID URINE DRUG SCREEN, HOSP PERFORMED
Amphetamines: NOT DETECTED
Barbiturates: NOT DETECTED
Benzodiazepines: NOT DETECTED
Cocaine: NOT DETECTED
Opiates: NOT DETECTED
Tetrahydrocannabinol: POSITIVE — AB

## 2021-02-17 LAB — BASIC METABOLIC PANEL
Anion gap: 16 — ABNORMAL HIGH (ref 5–15)
BUN: 7 mg/dL (ref 6–20)
CO2: 20 mmol/L — ABNORMAL LOW (ref 22–32)
Calcium: 9.2 mg/dL (ref 8.9–10.3)
Chloride: 105 mmol/L (ref 98–111)
Creatinine, Ser: 0.65 mg/dL (ref 0.44–1.00)
GFR, Estimated: >60 mL/min (ref >60)
Glucose, Bld: 89 mg/dL (ref 70–99)
Potassium: 3.7 mmol/L (ref 3.5–5.1)
Sodium: 141 mmol/L (ref 135–145)

## 2021-02-17 LAB — ETHANOL: Alcohol, Ethyl (B): 348 mg/dL (ref <10)

## 2021-02-17 LAB — I-STAT BETA HCG BLOOD, ED (MC, WL, AP ONLY): I-stat hCG, quantitative: <5 m[IU]/mL (ref <5)

## 2021-02-17 LAB — CBG MONITORING, ED: Glucose-Capillary: 89 mg/dL (ref 70–99)

## 2021-02-17 MED ORDER — SODIUM CHLORIDE 0.9 % IV BOLUS
1000.0000 mL | Freq: Once | INTRAVENOUS | Status: AC
  Administered 2021-02-17: 1000 mL via INTRAVENOUS
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
## 2021-02-17 NOTE — ED Triage Notes (Addendum)
Per EMS- patient told EMS that she has been drinking alcohol today. Patient fell several times on grass. Patient's mother called EMS because the patient keeps falling and passing out.. Patient c/o upper back pain.

## 2021-02-23 ENCOUNTER — Other Ambulatory Visit: Payer: Self-pay

## 2021-02-23 ENCOUNTER — Other Ambulatory Visit (HOSPITAL_COMMUNITY)
Admission: RE | Admit: 2021-02-23 | Discharge: 2021-02-23 | Disposition: A | Discharge status: Home / Self Care | Payer: Medicaid Other | Source: Ambulatory Visit | Attending: Family Medicine | Admitting: Family Medicine

## 2021-02-23 ENCOUNTER — Encounter: Payer: Self-pay | Admitting: Family Medicine

## 2021-02-23 ENCOUNTER — Ambulatory Visit (INDEPENDENT_AMBULATORY_CARE_PROVIDER_SITE_OTHER): Payer: Medicaid Other | Admitting: Family Medicine

## 2021-02-23 VITALS — BP 132/95 | HR 101 | Wt 142.8 lb

## 2021-02-23 DIAGNOSIS — Z124 Encounter for screening for malignant neoplasm of cervix: Secondary | ICD-10-CM | POA: Diagnosis not present

## 2021-02-23 NOTE — Progress Notes (Signed)
    SUBJECTIVE:   CHIEF COMPLAINT / HPI:   Needs PAP smear Patient presents to the clinic for a PAP smear. She is sexually active with one female partner. Endorses using protection every time. Denies vaginal discharge or discomfort. Denies pruritus, fever, chills and dysuria. LMP was 3/9  and was a typical cycle that lasts for about 3 days. Last PAP was notable for trichomonas but otherwise negative including negative HPV risk on 01/22/2020.   OBJECTIVE:   BP (!) 132/95   Pulse (!) 101   Wt 142 lb 12.8 oz (64.8 kg)   LMP 02/11/2021   SpO2 100%   BMI 24.51 kg/m   General: Patient well-appearing, in no acute distress. CV: RRR Resp: CTAB GU: no external lesions or rash noted, no vaginal or cervical discharge noted Neuro: normal gait Psych: mood appropriate  GU exam performed in the presence of a chaperone.  ASSESSMENT/PLAN:   Encounter for Papanicolaou smear of cervix -pending PAP cytology with HPV cotesting, will notify patient of any abnormal results -encouraged continuing safe sex practices   Elevated Blood Pressure BP in the office 148/99 and upon recheck 132/95. Patient had recent hospitalization for alcohol intoxication/possible seizure. Although currently asymptomatic, encouraged balanced diet and regular exercise. Instructed to check BP at home and record, bring recording at next visit. Follow up in 1 month for BP check. -Consider COVID vaccine at follow up visit.   Reece Leader, DO [facility]

## 2021-02-23 NOTE — Patient Instructions (Addendum)
It was great seeing you today!  Today we performed a PAP, I will let you know of any abnormal results.  Your blood pressure was initially a little high today, please check your blood pressure daily at home and record the readings. Please bring this recording into your next appointment. As we discussed, ensure that you do not smoke at least 30 minutes prior to checking your blood pressure, be seated comfortably for at least 5 minutes with your feet firmly on the floor in a seated position. Please eat a balanced diet and exercise as well.   Please follow up in 1 month for your next scheduled appointment, if anything arises between now and then, please don't hesitate to contact our office.   Thank you for allowing [place] to be a part of your medical care!  Thank you, Dr. Robyne Peers

## 2021-02-23 NOTE — Assessment & Plan Note (Signed)
-  pending PAP cytology with HPV cotesting, will notify patient of any abnormal results -encouraged continuing safe sex practices

## 2021-02-25 ENCOUNTER — Encounter: Payer: Self-pay | Admitting: Family Medicine

## 2021-02-25 LAB — CYTOLOGY - PAP
Chlamydia: NEGATIVE
Comment: NEGATIVE
Comment: NEGATIVE
Comment: NEGATIVE
Comment: NORMAL
Diagnosis: NEGATIVE
High risk HPV: NEGATIVE
Neisseria Gonorrhea: NEGATIVE
Trichomonas: NEGATIVE

## 2021-03-05 ENCOUNTER — Other Ambulatory Visit: Payer: Self-pay

## 2021-03-05 ENCOUNTER — Ambulatory Visit (INDEPENDENT_AMBULATORY_CARE_PROVIDER_SITE_OTHER): Payer: Medicaid Other | Admitting: Family Medicine

## 2021-03-05 VITALS — BP 112/72 | HR 87 | Ht 64.0 in | Wt 147.2 lb

## 2021-03-05 DIAGNOSIS — H60502 Unspecified acute noninfective otitis externa, left ear: Secondary | ICD-10-CM | POA: Insufficient documentation

## 2021-03-05 MED ORDER — CIPROFLOXACIN-DEXAMETHASONE 0.3-0.1 % OT SUSP: 4.0000 [drp] | Freq: Two times a day (BID) | OTIC | 0 refills | Status: DC

## 2021-03-05 NOTE — Assessment & Plan Note (Signed)
Patient with 1 week history of pain in left ear.  Noticed pus drainage today.  Physical exam consistent with otitis externa.  Prescribed Ciprodex for patient to use twice daily.  We scheduled her for a follow-up visit in 1 week to check for improvement and possibly clear debris if able.  Strict ED and return precautions given.

## 2021-03-05 NOTE — Patient Instructions (Signed)
It was great seeing you today.  You have an ear infection called otitis externa.  I have sent a prescription for antibiotics to your pharmacy.  I would like for you to be seen in approximately 1 week to ensure that it is improving and we may be able to flush some of the debris out of your ear.  If you have any questions or concerns please feel free to call the clinic.  I hope you have a wonderful afternoon!   Otitis Externa  Otitis externa is an infection of the outer ear canal. The outer ear canal is the area between the outside of the ear and the eardrum. Otitis externa is sometimes called swimmer's ear. What are the causes? Common causes of this condition include:  Swimming in dirty water.  Moisture in the ear.  An injury to the inside of the ear.  An object stuck in the ear.  A cut or scrape on the outside of the ear. What increases the risk? You are more likely to get this condition if you go swimming often. What are the signs or symptoms?  Itching in the ear. This is often the first symptom.  Swelling of the ear.  Redness in the ear.  Ear pain. The pain may get worse when you pull on your ear.  Pus coming from the ear. How is this treated? This condition may be treated with:  Antibiotic ear drops. These are often given for 10-14 days.  Medicines to reduce itching and swelling. Follow these instructions at home:  If you were given antibiotic ear drops, use them as told by your doctor. Do not stop using them even if your condition gets better.  Take over-the-counter and prescription medicines only as told by your doctor.  Avoid getting water in your ears as told by your doctor. You may be told to avoid swimming or water sports for a few days.  Keep all follow-up visits as told by your doctor. This is important. How is this prevented?  Keep your ears dry. Use the corner of a towel to dry your ears after you swim or bathe.  Try not to scratch or put things in your  ear. Doing these things makes it easier for germs to grow in your ear.  Avoid swimming in lakes, dirty water, or pools that may not have the right amount of a chemical called chlorine. Contact a doctor if:  You have a fever.  Your ear is still red, swollen, or painful after 3 days.  You still have pus coming from your ear after 3 days.  Your redness, swelling, or pain gets worse.  You have a really bad headache.  You have redness, swelling, pain, or tenderness behind your ear. Summary  Otitis externa is an infection of the outer ear canal.  Symptoms include pain, redness, and swelling of the ear.  If you were given antibiotic ear drops, use them as told by your doctor. Do not stop using them even if your condition gets better.  Try not to scratch or put things in your ear. This information is not intended to replace advice given to you by your health care provider. Make sure you discuss any questions you have with your health care provider. Document Revised: 04/28/2018 Document Reviewed: 04/28/2018 Elsevier Patient Education  2021 [employer].

## 2021-03-05 NOTE — Progress Notes (Signed)
    SUBJECTIVE:   CHIEF COMPLAINT / HPI:   Ear pain Patient reports that over the last week she has had worsening ear pain.  She tried to make an appointment but this was the soonest appointment she could have.  She reports that this morning she woke up and was having difficulty hearing so she stuck a Q-tip in her ear and noticed a large amount of pus came out with a Q-tip but her hearing improved.  She is also noticed continued drainage from the ear.  She reports a chronic history of having issues with this.  Denies any increased warmth around the ear, fever.  PERTINENT  PMH / PSH: Deafness in right ear  OBJECTIVE:   BP 112/72   Pulse 87   Ht 5\' 4"  (1.626 m)   Wt 147 lb 3.2 oz (66.8 kg)   LMP 02/11/2021   SpO2 98%   BMI 25.27 kg/m   General: Patient is well-appearing in no acute distress HEENT: Left external auditory canal has pus, erythema noted, unable to visualize tympanic membrane due to pus.  Patient is having severe pain as well in that ear. Respiratory: Normal work of breathing MSK ambulates without difficulty  ASSESSMENT/PLAN:   Acute otitis externa of left ear Patient with 1 week history of pain in left ear.  Noticed pus drainage today.  Physical exam consistent with otitis externa.  Prescribed Ciprodex for patient to use twice daily.  We scheduled her for a follow-up visit in 1 week to check for improvement and possibly clear debris if able.  Strict ED and return precautions given.     04/13/2021, MD [facility]

## 2021-03-10 ENCOUNTER — Other Ambulatory Visit: Payer: Self-pay

## 2021-03-10 ENCOUNTER — Ambulatory Visit (INDEPENDENT_AMBULATORY_CARE_PROVIDER_SITE_OTHER): Payer: Medicaid Other | Admitting: Family Medicine

## 2021-03-10 DIAGNOSIS — E162 Hypoglycemia, unspecified: Secondary | ICD-10-CM

## 2021-03-18 ENCOUNTER — Other Ambulatory Visit: Payer: Self-pay | Admitting: *Deleted

## 2021-03-18 NOTE — Progress Notes (Signed)
Patient left before appt. Autumn Kaiser, CMA

## 2021-03-27 ENCOUNTER — Ambulatory Visit: Payer: Medicaid Other | Admitting: Family Medicine

## 2021-03-28 ENCOUNTER — Encounter (HOSPITAL_COMMUNITY): Payer: Self-pay | Admitting: *Deleted

## 2021-03-28 ENCOUNTER — Other Ambulatory Visit: Payer: Self-pay

## 2021-03-28 ENCOUNTER — Emergency Department (HOSPITAL_COMMUNITY)
Admission: EM | Admit: 2021-03-28 | Discharge: 2021-03-29 | Disposition: A | Discharge status: Home / Self Care | Payer: Medicaid Other | Attending: Emergency Medicine | Admitting: Emergency Medicine

## 2021-03-28 DIAGNOSIS — Z5321 Procedure and treatment not carried out due to patient leaving prior to being seen by health care provider: Secondary | ICD-10-CM | POA: Insufficient documentation

## 2021-03-28 DIAGNOSIS — R21 Rash and other nonspecific skin eruption: Secondary | ICD-10-CM | POA: Diagnosis present

## 2021-03-28 DIAGNOSIS — L299 Pruritus, unspecified: Secondary | ICD-10-CM | POA: Diagnosis not present

## 2021-03-28 NOTE — ED Triage Notes (Signed)
Rash over her body for 2 weeks with itching  lmp thid month

## 2021-03-31 NOTE — Progress Notes (Signed)
Subjective:   Patient ID: Autumn Kaiser    DOB: 1991/01/05, 30 y.o. female   MRN: [id number]  Autumn Kaiser is a 30 y.o. female with a history of hypoglycemia, AOM of left ear, bipolar disorder, depression and tobacco user here for rash  Rash: Patient to ED on 4/23 for rash but appears never was seen. She notes a rash on bilateral back that is moving around to abdomen and breasts x 1.5 weeks. Notes it started out as a single lesion that has now spread every where. Endorses pruritis, burning. Treating with Whichhazel and alcohol and it helps temporarily.  Does have a history of eczema but that normally flares on flexural surfaces. This appears different.  Denies trauma. Denies new soaps, lotions, or detergents. Denies recent travel. Denies camping but does enjoy sitting outside and [place]. Denies any tick bite. Denies seeing any another medical professional for this. Denies any fevers, chills, change in mentation. Denies environmental exposures.  Does note possible exposure to scabies several weeks back.  PMH: atopic dermatitis, asthma Medications: as needed Tylenol  Alcohol: usually heavy drinker of 2-3 24oz to 2L beer, sometimes liquor. Last drink last month.  THC: occasionally  IV drug use: denies History of STI's. Sexually active 1 female partner.    Review of Systems:  Per HPI.   Objective:   BP 114/78   Pulse (!) 115   Ht '5\' 4"'  (1.626 m)   Wt 143 lb 3.2 oz (65 kg)   LMP 03/10/2021   SpO2 99%   BMI 24.58 kg/m  Vitals and nursing note reviewed.  General: pleasant young female, sitting comfortably in exam chair, well nourished, well developed, in no acute distress with non-toxic appearance Resp: breathing comfortably on room air, speaking in full sentences Skin: warm, dry, fine erythematous papular rash diffusely on back with some excoriations, no rash on palms, soles, no appreciable rash on abdomen or chest, no involvement of mucous membranes, no  rash in finger webs Extremities: warm and well perfused MSK: gait normal Neuro: Alert and oriented, speech normal        Assessment & Plan:   Rash Acute erythematous pruritic papular rash on back. Does not appear consistent with scabies, syphilis, or arthropod bite at this time. Overall improving per patient. Possibly contact? Will treat with Triamcinolone 0.5% ointment BID. Follow up in 1-2 weeks to monitor for improvement. If no improvement, recommend RPR, skin scraping, and further evaluation.   Unexplained weight loss Patient has a chronic history of alcohol abuse (drink 2-3 24-oz to 2L of beer/day +- liquor). Last drink one month ago. Has history of syncope in setting of heavy alcohol use. Denies seizures or history of hospitalization for alcohol. She is also noted to have unexplained weight loss since March 2022 (160lbs to 143lbs today). She also endorses night sweats. She notes poor appetite. Patient has not been evaluated for this before.   Recommended follow up for further evaluation, thorough exam, and basic labs +/- imaging pending history and exam findings. Differential is broad and includes malignancy, nonmalignant GI diseases (PUD, celiacs, IBD), psych disorders, endocrinopathies (thyroid, DM, adrenal), infectious (HIV, TB), neurologic, meds/substances, eating disorders. Substance abuse (alcohol, cocaine, amphetamines, tobacco, prescription meds) can also contribute to weight loss. Recommended basic labs include CBC with diff, HIV, CMP, TSH, A1C, ESR/CRP, Hepatitis, CXR, and age appropriate screenings. Maybe consider RUQ ultrasound and thiamine levels given history of alcohol abuse. Also evaluate for mood disorders.  Scheduled for  follow up of this on 04/08/21. PCP not available.   No orders of the defined types were placed in this encounter.  Meds ordered this encounter  Medications  . triamcinolone ointment (KENALOG) 0.5 %    Sig: Apply 1 application topically 2 (two) times  daily for 14 days.    Dispense:  120 g    Refill:  0     Mina Marble, DO PGY-3, [facility] 04/02/2021 1:21 PM

## 2021-04-01 ENCOUNTER — Other Ambulatory Visit: Payer: Self-pay

## 2021-04-01 ENCOUNTER — Ambulatory Visit (INDEPENDENT_AMBULATORY_CARE_PROVIDER_SITE_OTHER): Payer: Medicaid Other | Admitting: Family Medicine

## 2021-04-01 ENCOUNTER — Encounter: Payer: Self-pay | Admitting: Family Medicine

## 2021-04-01 VITALS — BP 114/78 | HR 115 | Ht 64.0 in | Wt 143.2 lb

## 2021-04-01 DIAGNOSIS — R634 Abnormal weight loss: Secondary | ICD-10-CM

## 2021-04-01 DIAGNOSIS — R21 Rash and other nonspecific skin eruption: Secondary | ICD-10-CM | POA: Diagnosis not present

## 2021-04-01 MED ORDER — TRIAMCINOLONE ACETONIDE 0.5 % EX OINT
1.0000 | TOPICAL_OINTMENT | Freq: Two times a day (BID) | CUTANEOUS | 0 refills | Status: AC
Start: 2021-04-01 — End: 2021-04-15

## 2021-04-01 NOTE — Patient Instructions (Signed)
Use Triamcinolone ointment twice a day for 14 days. Does not look like scabies at this time Look for triggers Follow up next week for weight loss evaluation

## 2021-04-01 NOTE — Progress Notes (Signed)
Patient left without being seen.

## 2021-04-02 DIAGNOSIS — R634 Abnormal weight loss: Secondary | ICD-10-CM | POA: Insufficient documentation

## 2021-04-02 NOTE — Assessment & Plan Note (Addendum)
Patient has a chronic history of alcohol abuse (drink 2-3 24-oz to 2L of beer/day +- liquor). Last drink one month ago. Has history of syncope in setting of heavy alcohol use. Denies seizures or history of hospitalization for alcohol. She is also noted to have unexplained weight loss since March 2022 (160lbs to 143lbs today). She also endorses night sweats. She notes poor appetite. Patient has not been evaluated for this before.   Recommended follow up for further evaluation, thorough exam, and basic labs +/- imaging pending history and exam findings. Differential is broad and includes malignancy, nonmalignant GI diseases (PUD, celiacs, IBD), psych disorders, endocrinopathies (thyroid, DM, adrenal), infectious (HIV, TB), neurologic, meds/substances, eating disorders. Substance abuse (alcohol, cocaine, amphetamines, tobacco, prescription meds) can also contribute to weight loss. Recommended basic labs include CBC with diff, HIV, CMP, TSH, A1C, ESR/CRP, Hepatitis, CXR, and age appropriate screenings. Maybe consider RUQ ultrasound and thiamine levels given history of alcohol abuse. Also evaluate for mood disorders.  Scheduled for follow up of this on 04/08/21. PCP not available.  

## 2021-04-02 NOTE — Assessment & Plan Note (Addendum)
Acute erythematous pruritic papular rash on back. Does not appear consistent with scabies, syphilis, or arthropod bite at this time. Overall improving per patient. Possibly contact? Will treat with Triamcinolone 0.5% ointment BID. Follow up in 1-2 weeks to monitor for improvement. If no improvement, recommend RPR, skin scraping, and further evaluation.

## 2021-04-08 ENCOUNTER — Ambulatory Visit (INDEPENDENT_AMBULATORY_CARE_PROVIDER_SITE_OTHER): Payer: Medicaid Other | Admitting: Family Medicine

## 2021-04-08 ENCOUNTER — Other Ambulatory Visit: Payer: Self-pay

## 2021-04-08 ENCOUNTER — Encounter: Payer: Self-pay | Admitting: Family Medicine

## 2021-04-08 VITALS — BP 90/60 | HR 78 | Ht 64.0 in | Wt 147.5 lb

## 2021-04-08 DIAGNOSIS — R634 Abnormal weight loss: Secondary | ICD-10-CM

## 2021-04-08 DIAGNOSIS — Z114 Encounter for screening for human immunodeficiency virus [HIV]: Secondary | ICD-10-CM

## 2021-04-08 DIAGNOSIS — E162 Hypoglycemia, unspecified: Secondary | ICD-10-CM

## 2021-04-08 DIAGNOSIS — R7989 Other specified abnormal findings of blood chemistry: Secondary | ICD-10-CM | POA: Diagnosis not present

## 2021-04-08 LAB — POCT GLYCOSYLATED HEMOGLOBIN (HGB A1C): HbA1c, POC (controlled diabetic range): 5.4 % (ref 0.0–7.0)

## 2021-04-08 NOTE — Progress Notes (Signed)
    SUBJECTIVE:   CHIEF COMPLAINT / HPI:   Unexplained weight loss Patient concerned about weight loss, reports that she does not check her weight at home but when she came into the office in the last several months she has noticed a weight loss from the 160s and 140s.  She states that she also feels like she has been having night sweats for the last several months, occasional blurry vision headaches, occasional palpitations, occasional constipation diarrhea.  Denies fever, swelling/lymphadenopathy, hair loss.  Denies eating behavior disorders, but does state that her appetite will be low and she would not eat much for a few days.  PHQ-9 4/10.  PERTINENT  PMH / PSH: Bipolar disorder, recurrent depression, hypoglycemia  OBJECTIVE:   BP 90/60   Pulse 78   Ht 5\' 4"  (1.626 m)   Wt 147 lb 8 oz (66.9 kg)   LMP 03/10/2021   SpO2 98%   BMI 25.32 kg/m   Gen: well-appearing, NAD Head: NCAT, no swelling appreciated CV: RRR, no m/r/g appreciated, no peripheral edema Pulm: breathing comfortably, no respiratory distress, no retractions, able to speak in full sentences  ASSESSMENT/PLAN:  Unexplained weight loss Chart review shows weight since 2018 more consistently in the 140s-150s.  On 3/22, patient had weight of 160 and 6 days later weight was 142.  Given patient's lack of overall concerning symptoms and more consistent weight pattern in the chart do not feel like this weight was accurate.  Discussed with patient and showed weight trend in the chart.  Patient has not been screened for HIV recently, and does not know of any risk factors or exposure. -CBC with differential -HIV -Follow-up in 2 to 3 months if continuing to have concerning weight loss/changes in body habitus  Elevated Hgb on CBC CBC on 02/17/2021 showed Hgb of 17.0, RBC 5.86, HCT 54.1.  Consideration that this could have been related to a dehydration type picture, but given concern for weight loss and patient's reported tiredness and  night sweats we will evaluate with a CBC with differential to ensure no abnormality in the cell lines. - CBC w/diff  Hx of hypoglycemia HbA1c today 5.4, previously in pre-diabetic range of 5.6 but now improved.   02/19/2021, DO [facility]

## 2021-04-08 NOTE — Patient Instructions (Signed)
When reviewing your chart, it appears that since 2018, for the most part your weight has been around 140s-150s. I think that the weight that was documented at 160 could have been incorrect. None of your current symptoms have me overly concerned about your weight, but we can have an appointment to come back in the next 2-3 months if you continue to have concerns or start to notice concerning changes in your body. We will be getting labs to check your blood levels and check for HIV to make sure that we rule out some big diagnoses.

## 2021-04-09 LAB — CBC WITH DIFFERENTIAL/PLATELET
Basophils Absolute: 0 10*3/uL (ref 0.0–0.2)
Basos: 0 %
EOS (ABSOLUTE): 0.1 10*3/uL (ref 0.0–0.4)
Eos: 2 %
Hematocrit: 42.9 % (ref 34.0–46.6)
Hemoglobin: 14.4 g/dL (ref 11.1–15.9)
Immature Grans (Abs): 0 10*3/uL (ref 0.0–0.1)
Immature Granulocytes: 0 %
Lymphocytes Absolute: 1.3 10*3/uL (ref 0.7–3.1)
Lymphs: 38 %
MCH: 29.4 pg (ref 26.6–33.0)
MCHC: 33.6 g/dL (ref 31.5–35.7)
MCV: 88 fL (ref 79–97)
Monocytes Absolute: 0.3 10*3/uL (ref 0.1–0.9)
Monocytes: 8 %
Neutrophils Absolute: 1.7 10*3/uL (ref 1.4–7.0)
Neutrophils: 52 %
Platelets: 255 10*3/uL (ref 150–450)
RBC: 4.9 x10E6/uL (ref 3.77–5.28)
RDW: 13 % (ref 11.7–15.4)
WBC: 3.3 10*3/uL — ABNORMAL LOW (ref 3.4–10.8)

## 2021-04-09 LAB — HIV ANTIBODY (ROUTINE TESTING W REFLEX): HIV Screen 4th Generation wRfx: NONREACTIVE

## 2021-04-14 ENCOUNTER — Other Ambulatory Visit: Payer: Self-pay

## 2021-04-14 ENCOUNTER — Ambulatory Visit (HOSPITAL_COMMUNITY)
Admission: EM | Admit: 2021-04-14 | Discharge: 2021-04-14 | Disposition: A | Discharge status: Home / Self Care | Payer: Medicaid Other | Attending: Student | Admitting: Student

## 2021-04-14 ENCOUNTER — Encounter (HOSPITAL_COMMUNITY): Payer: Self-pay | Admitting: Emergency Medicine

## 2021-04-14 DIAGNOSIS — R0789 Other chest pain: Secondary | ICD-10-CM | POA: Diagnosis not present

## 2021-04-14 NOTE — Discharge Instructions (Addendum)
-  Your EKG looks normal today.  However, the EKG is only a brief picture in time of your heart.  This cannot completely rule out a cardiac event.  To make sure your heart is okay, this requires an emergency room evaluation including blood work. -If you experience worsening of your symptoms, including worsening of the pain, dizziness, shortness of breath-stop and had straight to the emergency room or call 911 immediately. -Follow-up with your primary care for a referral to cardiology, or you can schedule this appointment directly by calling.  Information below.

## 2021-04-14 NOTE — ED Triage Notes (Signed)
Pt presents today with c/o of chest pain that began yesterday. She reports waking up this morning for Court and realized she was still having chest pain. She called the Court and was told by "judge" to come get a note/
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
## 2021-09-23 ENCOUNTER — Emergency Department (HOSPITAL_COMMUNITY)
Admission: EM | Admit: 2021-09-23 | Discharge: 2021-09-23 | Disposition: A | Discharge status: Home / Self Care | Payer: Medicaid Other | Attending: Emergency Medicine | Admitting: Emergency Medicine

## 2021-09-23 ENCOUNTER — Encounter (HOSPITAL_COMMUNITY): Payer: Self-pay | Admitting: Emergency Medicine

## 2021-09-23 DIAGNOSIS — Z20822 Contact with and (suspected) exposure to covid-19: Secondary | ICD-10-CM | POA: Insufficient documentation

## 2021-09-23 DIAGNOSIS — Z5321 Procedure and treatment not carried out due to patient leaving prior to being seen by health care provider: Secondary | ICD-10-CM | POA: Diagnosis not present

## 2021-09-23 DIAGNOSIS — R519 Headache, unspecified: Secondary | ICD-10-CM | POA: Insufficient documentation

## 2021-09-23 DIAGNOSIS — R059 Cough, unspecified: Secondary | ICD-10-CM | POA: Insufficient documentation

## 2021-09-23 LAB — RESP PANEL BY RT-PCR (FLU A&B, COVID) ARPGX2
Influenza A by PCR: NEGATIVE
Influenza B by PCR: NEGATIVE
SARS Coronavirus 2 by RT PCR: NEGATIVE

## 2021-09-23 NOTE — ED Notes (Signed)
No answer for treatment room. 

## 2021-09-23 NOTE — ED Notes (Signed)
No answer for treatment room and x-ray

## 2021-09-23 NOTE — ED Triage Notes (Signed)
Patient here with complaint of headache and cough, close contact tested positive for RSV. Patient alert, oriented, and in no apparent distress at this time.

## 2021-09-23 NOTE — ED Provider Notes (Signed)
Emergency Medicine Provider Triage Evaluation Note  Autumn Kaiser , a 30 y.o. female  was evaluated in triage.  Pt complains of generalized headache, productive cough, rhinorrhea, neurolyse myalgias, and chills.  Symptoms have been present over the last 2 to 3 days.  Patient has had close contact with family member who tested positive for RSV.  Patient has been vaccinated for COVID-19 however has not received any booster.  No vaccination for influenza.  Review of Systems  Positive: generalized headache, productive cough, rhinorrhea, neurolyse myalgias, and chills Negative: Fever, abdominal pain, chest pain  Physical Exam  BP 124/89 (BP Location: Right Arm)   Pulse 85   Temp 99.1 F (37.3 C) (Oral)   Resp 18   SpO2 100%  Gen:   Awake, no distress   Resp:  Normal effort, lungs clear to auscultation bilaterally MSK:   Moves extremities without difficulty  Other:    Medical Decision Making  Medically screening exam initiated at 12:43 PM.  Appropriate orders placed.  Autumn Kaiser was informed that the remainder of the evaluation will be completed by another provider, this initial triage assessment does not replace that evaluation, and the importance of remaining in the ED until their evaluation is complete.  Chest x-ray to evaluate for possible pneumonia due to productive cough.  We will swab patient for COVID-19 and influenza.   Haskel Schroeder, PA-C 09/23/21 1244    Gwyneth Sprout, MD 09/30/21 [phone]

## 2021-12-14 ENCOUNTER — Ambulatory Visit: Payer: Medicaid Other

## 2021-12-17 ENCOUNTER — Ambulatory Visit: Payer: Medicaid Other | Admitting: Family Medicine

## 2022-01-17 IMAGING — DX DG HAND COMPLETE 3+V*R*
3 series · 3 of 3 positions shown · non-contrast
Comparison: None.

CLINICAL DATA: Injury

EXAM:
RIGHT HAND - COMPLETE 3+ VIEW

[hand pa]
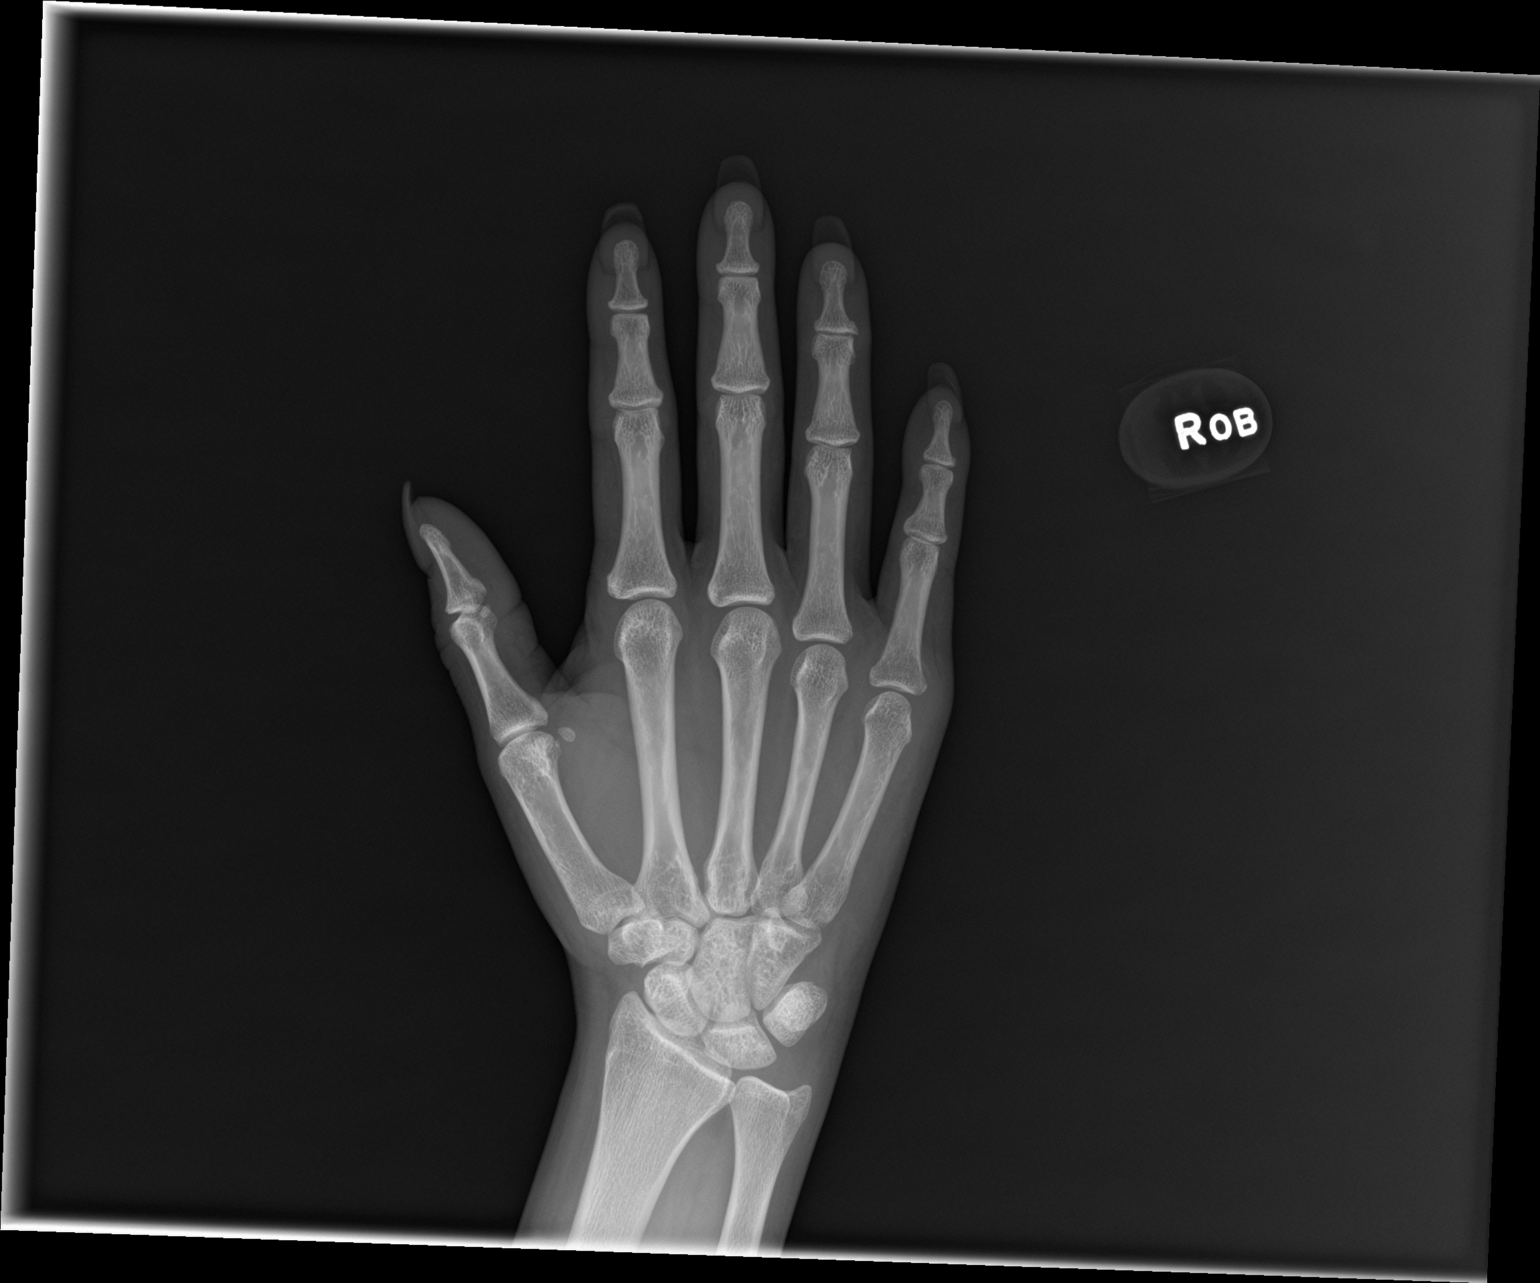

[hand obl]
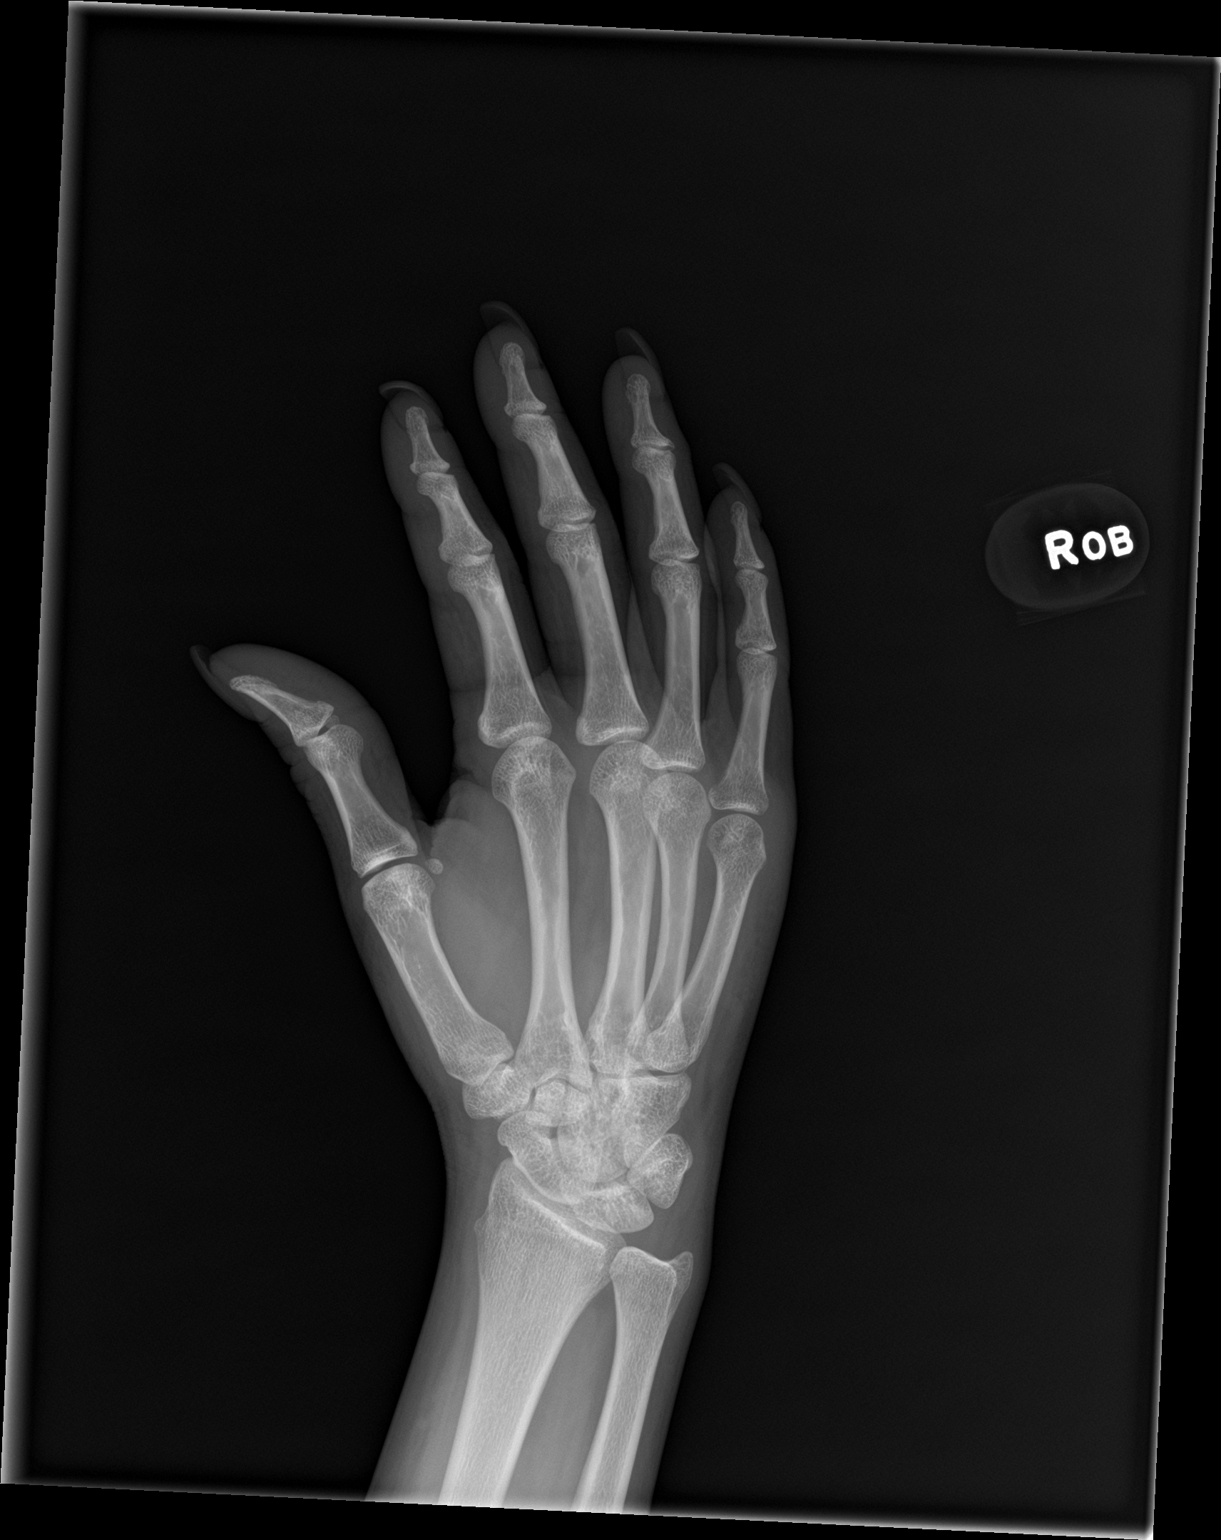

[hand lat]
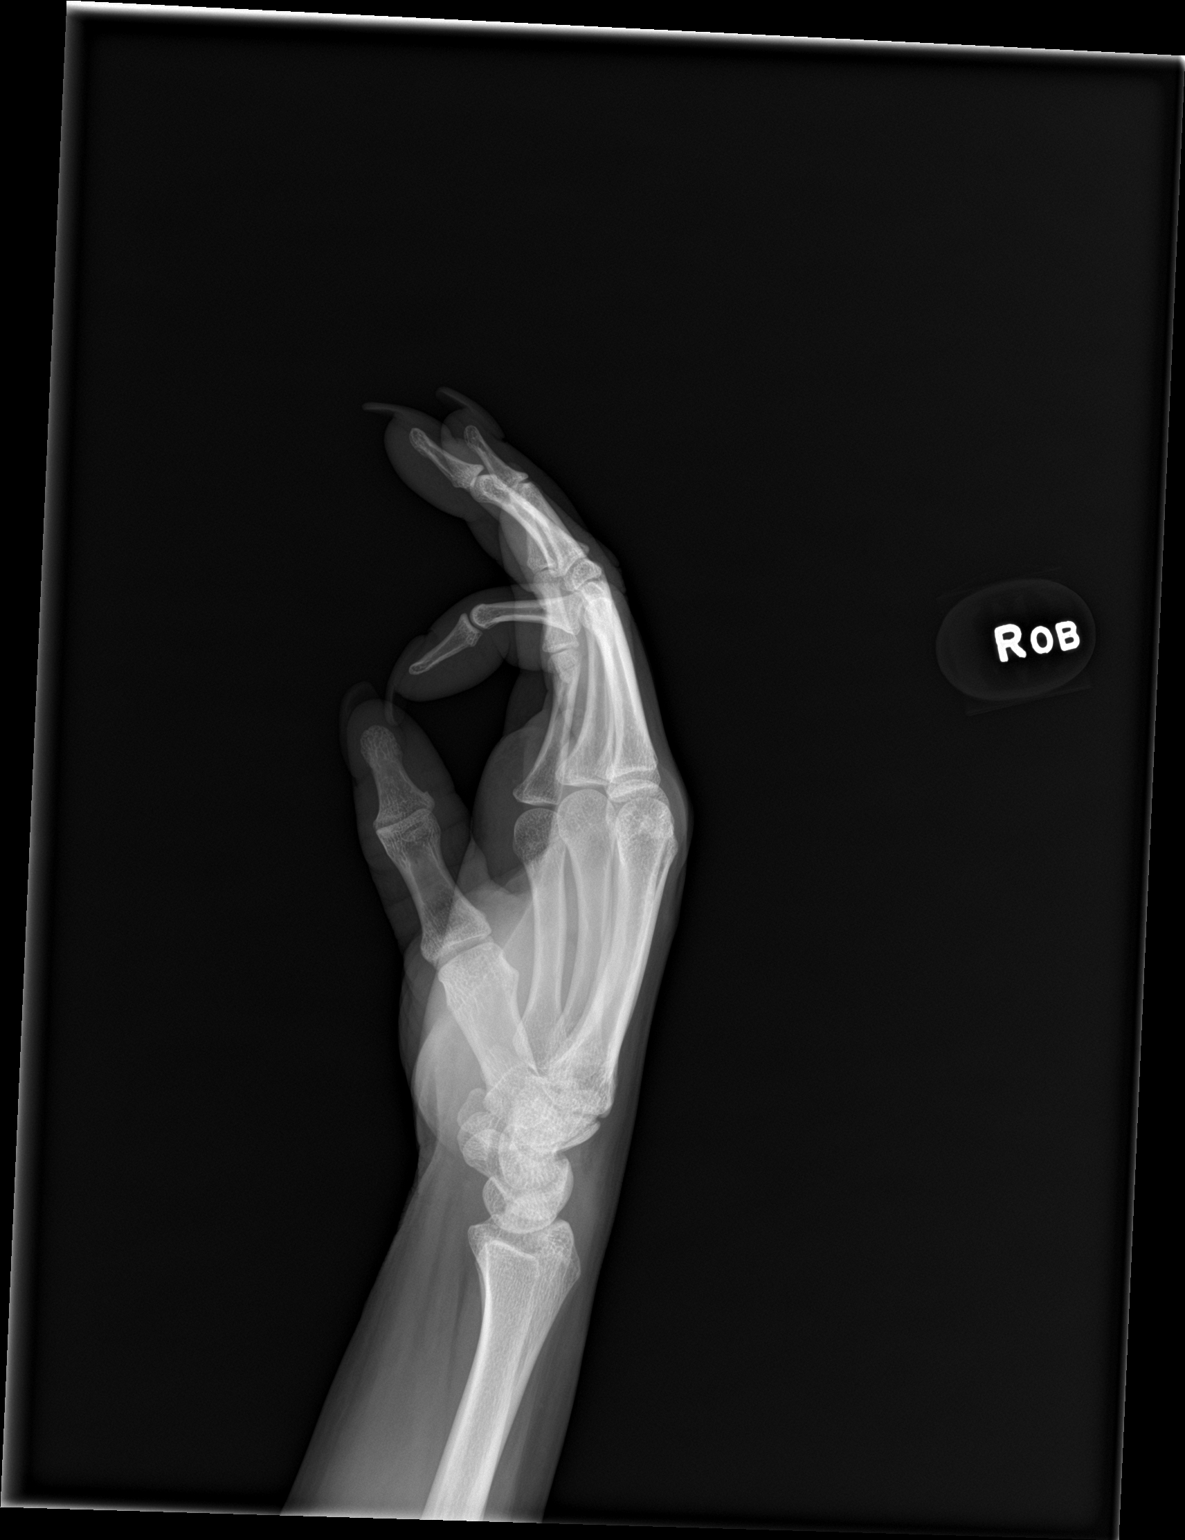

[3 of 3 positions shown; findings below may reference images not displayed]

FINDINGS: There is no evidence of fracture or dislocation. There is no
evidence of arthropathy or other focal bone abnormality. Mild dorsal
soft tissue swelling is seen.
IMPRESSION: Negative.

## 2022-03-03 ENCOUNTER — Ambulatory Visit: Payer: Medicaid Other | Admitting: Student

## 2022-03-03 NOTE — Progress Notes (Deleted)
?  SUBJECTIVE:  ? ?CHIEF COMPLAINT / HPI:  ? ?Boil in private area, and needing eczema cream ? ?PERTINENT  PMH / PSH: Depression, bipolar disorder ? ? ? ?OBJECTIVE:  ?There were no vitals taken for this visit. ? ?General: NAD, pleasant, able to participate in exam ?Cardiac: RRR, no murmurs auscultated. ?Respiratory: CTAB, normal effort, no wheezes, rales or rhonchi ?Abdomen: soft, non-tender, non-distended, normoactive bowel sounds ?Extremities: warm and well perfused, no edema or cyanosis. ?Skin: warm and dry, no rashes noted ?Neuro: alert, no obvious focal deficits, speech normal ?Psych: Normal affect and mood ? ?ASSESSMENT/PLAN:  ?No problem-specific Assessment & Plan notes found for this encounter. ?  ?No orders of the defined types were placed in this encounter. ? ?No orders of the defined types were placed in this encounter. ? ?No follow-ups on file. ?@SIGNNOTE @ ?{    This will disappear when note is signed, click to select method of visit    :1} ?

## 2022-03-03 NOTE — Patient Instructions (Incomplete)
It was great to see you! Thank you for allowing me to participate in your care!  I recommend that you always bring your medications to each appointment as this makes it easy to ensure we are on the correct medications and helps us not miss when refills are needed.  Our plans for today:  - *** -   We are checking some labs today, I will call you if they are abnormal will send you a MyChart message or a letter if they are normal.  If you do not hear about your labs in the next 2 weeks please let us know.***  Take care and seek immediate care sooner if you develop any concerns.   Dr. Livy Ross, MD [facility]  

## 2022-03-04 ENCOUNTER — Ambulatory Visit (INDEPENDENT_AMBULATORY_CARE_PROVIDER_SITE_OTHER): Payer: Medicaid Other | Admitting: Family Medicine

## 2022-03-04 ENCOUNTER — Encounter: Payer: Self-pay | Admitting: Family Medicine

## 2022-03-04 DIAGNOSIS — R21 Rash and other nonspecific skin eruption: Secondary | ICD-10-CM | POA: Diagnosis not present

## 2022-03-04 DIAGNOSIS — L0293 Carbuncle, unspecified: Secondary | ICD-10-CM | POA: Diagnosis not present

## 2022-03-04 MED ORDER — DOXYCYCLINE HYCLATE 100 MG PO TABS: 100.0000 mg | ORAL_TABLET | Freq: Two times a day (BID) | ORAL | 0 refills | Status: DC

## 2022-03-04 MED ORDER — TRIAMCINOLONE ACETONIDE 0.5 % EX CREA
1.0000 | TOPICAL_CREAM | Freq: Three times a day (TID) | CUTANEOUS | 1 refills | Status: DC
Start: 2022-03-04 — End: 2022-11-05

## 2022-03-04 NOTE — Patient Instructions (Addendum)
The problem that you may have which can cause recurrent boils is hidradenitis suppurativa.  You might want to google this problem to learn and see if it fits you. ?Since it is getting better, just keep using warm compresses.  I have sent in a prescription for antibiotics.  Only fill the prescription if the boil gets worse.   ?I refilled your cream ?

## 2022-03-05 ENCOUNTER — Encounter: Payer: Self-pay | Admitting: Family Medicine

## 2022-03-05 NOTE — Assessment & Plan Note (Signed)
Likely eczema.  Refilled triamcinolone. ?

## 2022-03-05 NOTE — Assessment & Plan Note (Signed)
Doubt this one will need drainage or rash.  Did send rx for doxy and instructied to fill only if worsens. ?Discussed hidrandenitis suppurativa with her.  She will search and learn more.   ?

## 2022-03-05 NOTE — Progress Notes (Signed)
? ? ?  SUBJECTIVE:  ? ?CHIEF COMPLAINT / HPI:  ? ?Boil on private parts.  Patient states she has a history of recurrent boils and has one again.  It seems to be improving with warm compresses.  No drainage on its own.  The recurrent boils are in the axilla and vulva.  No one has previously mention hidraentitis to her. ?Also asks for refill on her triamcinolone for a rash on her back and thighs.  States that it worked well and that she does not use it elsewhere. ? ? ? ?OBJECTIVE:  ? ?BP 137/75   Pulse 85   Wt 141 lb (64 kg)   LMP 02/03/2022   SpO2 100%   BMI 24.20 kg/m?   ?Small boil on mons which appears to be pointing.  Minimal to no surrounding erythema.   ?Minimal eczematoid rash on thighs and back.  Not infected.   ? ?ASSESSMENT/PLAN:  ? ?Recurrent boils ?Doubt this one will need drainage or rash.  Did send rx for doxy and instructied to fill only if worsens. ?Discussed hidrandenitis suppurativa with her.  She will search and learn more.   ? ?Rash ?Likely eczema.  Refilled triamcinolone. ?  ? ? ?Moses Manners, MD ?[facility]  ?

## 2022-03-29 ENCOUNTER — Encounter: Payer: Self-pay | Admitting: Family Medicine

## 2022-03-29 ENCOUNTER — Ambulatory Visit (INDEPENDENT_AMBULATORY_CARE_PROVIDER_SITE_OTHER): Payer: Medicaid Other | Admitting: Family Medicine

## 2022-03-29 VITALS — BP 132/80 | HR 90 | Wt 139.0 lb

## 2022-03-29 DIAGNOSIS — M25562 Pain in left knee: Secondary | ICD-10-CM | POA: Diagnosis not present

## 2022-03-29 MED ORDER — DICLOFENAC SODIUM 75 MG PO TBEC: 75.0000 mg | DELAYED_RELEASE_TABLET | Freq: Two times a day (BID) | ORAL | 0 refills | Status: DC
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
## 2022-03-29 NOTE — Patient Instructions (Addendum)
For your back pain, Take 1500 mg Tylenol twice a day. Stop by the pharmacy to pick up your Voltaren tablets.  ? ?Watch for worsening symptoms such as an increasing weakness or loss of sensation legs, increasing pain. Should any of these occur, go to the emergency department immediately. ? ?Go to one of the addresses below for an xray of your knee:  ? ?A ?[facility] ?[address] ? In [facility] ? [phone] ?Open ? Closes 5:30PM ? ?B ?[facility] ?[address] ? [phone] ?Open ? Closes 5PM ? ? ? ?

## 2022-03-30 ENCOUNTER — Telehealth: Payer: Self-pay

## 2022-03-30 DIAGNOSIS — M25562 Pain in left knee: Secondary | ICD-10-CM | POA: Insufficient documentation

## 2022-03-30 MED ORDER — NAPROXEN 500 MG PO TABS
500.0000 mg | ORAL_TABLET | Freq: Two times a day (BID) | ORAL | 0 refills | Status: DC
Start: 2022-03-30 — End: 2024-02-14

## 2022-03-30 NOTE — Telephone Encounter (Signed)
Patient calls nurse line requesting an extended return to work letter.  ? ?Patient reports she would like to take the rest of the week to rest her knee.  ? ?Original note was written to return to work on 4/26. However, patient requesting to return on 5/1. ? ?Will forward to provider who saw patient.  ?

## 2022-03-30 NOTE — Addendum Note (Signed)
Addended by: Katha Cabal D on: 03/30/2022 01:43 PM ? ? Modules accepted: Orders ? ?

## 2022-03-30 NOTE — Assessment & Plan Note (Signed)
Pt is a 31 yo female with acute knee pain after twisting injury a few days ago.  Exam concerning for meniscal injury, though has swelling on medial side and normal Valgus stress test, can not rule out MCL injury.  Obtain xray. Treat pain with Voltaren tablets and compression sleeve. She may need an MRI.  Pt aware of plan and agrees.  ?

## 2022-03-30 NOTE — Telephone Encounter (Signed)
Will switch to naprosyn. Thanks

## 2022-03-30 NOTE — Telephone Encounter (Signed)
Rec'd a PA request from pt's pharmacy regarding DICLOFENAC SODIUM 75MG  DR TABLETS. Medicaid prefers ibuprofen, indomethacin, ketorolac, meloxicam, naproxen, or sulindac. Would you like to move forward with the PA or send an alternative? ? ?Thanks! ?

## 2022-03-30 NOTE — Telephone Encounter (Signed)
Letter sent to RN team for distrubution

## 2022-04-02 ENCOUNTER — Encounter: Payer: Self-pay | Admitting: Family Medicine

## 2022-04-28 ENCOUNTER — Ambulatory Visit
Admission: RE | Admit: 2022-04-28 | Discharge: 2022-04-28 | Disposition: A | Discharge status: Home / Self Care | Payer: Medicaid Other | Source: Ambulatory Visit | Attending: Family Medicine | Admitting: Family Medicine

## 2022-04-28 DIAGNOSIS — M25562 Pain in left knee: Secondary | ICD-10-CM

## 2022-04-29 ENCOUNTER — Encounter: Payer: Self-pay | Admitting: Family Medicine

## 2022-05-11 ENCOUNTER — Encounter: Payer: Self-pay | Admitting: *Deleted

## 2022-05-14 ENCOUNTER — Ambulatory Visit: Payer: Medicaid Other | Admitting: Family Medicine

## 2022-08-23 ENCOUNTER — Ambulatory Visit: Payer: Commercial Managed Care - HMO | Admitting: Family Medicine

## 2022-11-05 ENCOUNTER — Other Ambulatory Visit: Payer: Self-pay

## 2022-11-05 DIAGNOSIS — R21 Rash and other nonspecific skin eruption: Secondary | ICD-10-CM

## 2022-11-08 MED ORDER — TRIAMCINOLONE ACETONIDE 0.5 % EX CREA: 1.0000 | TOPICAL_CREAM | Freq: Three times a day (TID) | CUTANEOUS | 1 refills | Status: DC

## 2023-11-17 ENCOUNTER — Ambulatory Visit: Payer: Commercial Managed Care - HMO

## 2023-11-17 ENCOUNTER — Telehealth: Payer: Self-pay

## 2023-11-17 NOTE — Telephone Encounter (Signed)
Patient calls nurse line requesting an apt.   She reports she is needing some medication refills.   She did not state which medications she needed. Its appears she only has Naproxen and Kenalog on he current medication list. She stated she did not need either one of those refilled.   She reports the medication is for her "mental health." Patient would not elaborate any further.   Patient scheduled for this afternoon in [facility] for evaluation.

## 2024-02-13 ENCOUNTER — Other Ambulatory Visit: Payer: Self-pay

## 2024-02-13 ENCOUNTER — Emergency Department (HOSPITAL_BASED_OUTPATIENT_CLINIC_OR_DEPARTMENT_OTHER)
Admission: EM | Admit: 2024-02-13 | Discharge: 2024-02-13 | Discharge status: Against Medical Advice | Attending: Emergency Medicine | Admitting: Emergency Medicine

## 2024-02-13 ENCOUNTER — Encounter (HOSPITAL_BASED_OUTPATIENT_CLINIC_OR_DEPARTMENT_OTHER): Payer: Self-pay | Admitting: Emergency Medicine

## 2024-02-13 DIAGNOSIS — M549 Dorsalgia, unspecified: Secondary | ICD-10-CM | POA: Insufficient documentation

## 2024-02-13 DIAGNOSIS — M542 Cervicalgia: Secondary | ICD-10-CM | POA: Diagnosis present

## 2024-02-13 DIAGNOSIS — Z5321 Procedure and treatment not carried out due to patient leaving prior to being seen by health care provider: Secondary | ICD-10-CM | POA: Diagnosis not present

## 2024-02-13 DIAGNOSIS — M25561 Pain in right knee: Secondary | ICD-10-CM | POA: Insufficient documentation

## 2024-02-13 DIAGNOSIS — Y92410 Unspecified street and highway as the place of occurrence of the external cause: Secondary | ICD-10-CM | POA: Diagnosis not present

## 2024-02-13 DIAGNOSIS — M25562 Pain in left knee: Secondary | ICD-10-CM | POA: Diagnosis not present
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
## 2024-02-14 ENCOUNTER — Ambulatory Visit (INDEPENDENT_AMBULATORY_CARE_PROVIDER_SITE_OTHER): Admitting: Family Medicine

## 2024-02-14 VITALS — BP 102/58 | HR 77 | Wt 143.0 lb

## 2024-02-14 DIAGNOSIS — R519 Headache, unspecified: Secondary | ICD-10-CM

## 2024-02-14 DIAGNOSIS — M542 Cervicalgia: Secondary | ICD-10-CM

## 2024-02-14 DIAGNOSIS — M546 Pain in thoracic spine: Secondary | ICD-10-CM | POA: Diagnosis not present

## 2024-02-14 DIAGNOSIS — L309 Dermatitis, unspecified: Secondary | ICD-10-CM | POA: Diagnosis not present

## 2024-02-14 MED ORDER — TIZANIDINE HCL 4 MG PO TABS
4.0000 mg | ORAL_TABLET | Freq: Two times a day (BID) | ORAL | 0 refills | Status: AC | PRN
Start: 2024-02-14 — End: ?

## 2024-02-14 MED ORDER — NAPROXEN 500 MG PO TABS: 500.0000 mg | ORAL_TABLET | Freq: Two times a day (BID) | ORAL | 0 refills | Status: AC

## 2024-02-14 MED ORDER — TRIAMCINOLONE ACETONIDE 0.5 % EX CREA: 1.0000 | TOPICAL_CREAM | Freq: Three times a day (TID) | CUTANEOUS | 1 refills | Status: AC
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
## 2024-02-14 NOTE — Assessment & Plan Note (Signed)
Stable, refill triamcinolone

## 2024-02-20 ENCOUNTER — Ambulatory Visit
Admission: RE | Admit: 2024-02-20 | Discharge: 2024-02-20 | Disposition: A | Discharge status: Home / Self Care | Source: Ambulatory Visit | Attending: Family Medicine | Admitting: Family Medicine

## 2024-02-20 DIAGNOSIS — M542 Cervicalgia: Secondary | ICD-10-CM

## 2024-02-22 ENCOUNTER — Encounter: Payer: Self-pay | Admitting: Family Medicine

## 2024-02-28 ENCOUNTER — Ambulatory Visit: Admitting: Family Medicine
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
## 2024-07-25 ENCOUNTER — Ambulatory Visit (HOSPITAL_COMMUNITY)

## 2024-11-23 ENCOUNTER — Ambulatory Visit: Payer: Self-pay | Admitting: Family Medicine
